# Patient Record
Sex: Female | Born: 1945 | Race: White | Hispanic: No | Smoking: Current every day smoker
Health system: Southern US, Community
[De-identification: ages and names within clinical notes are randomized; demographics above are authoritative.]

## PROBLEM LIST (undated history)

## (undated) DIAGNOSIS — Z87442 Personal history of urinary calculi: Secondary | ICD-10-CM

## (undated) DIAGNOSIS — E785 Hyperlipidemia, unspecified: Secondary | ICD-10-CM

## (undated) DIAGNOSIS — Z923 Personal history of irradiation: Secondary | ICD-10-CM

## (undated) DIAGNOSIS — C50412 Malignant neoplasm of upper-outer quadrant of left female breast: Secondary | ICD-10-CM

## (undated) DIAGNOSIS — Z889 Allergy status to unspecified drugs, medicaments and biological substances status: Secondary | ICD-10-CM

## (undated) DIAGNOSIS — I10 Essential (primary) hypertension: Secondary | ICD-10-CM

## (undated) HISTORY — PX: CATARACT EXTRACTION, BILATERAL: SHX1313

## (undated) HISTORY — PX: NO PAST SURGERIES: SHX2092

## (undated) HISTORY — DX: Hyperlipidemia, unspecified: E78.5

---

## 1998-08-07 ENCOUNTER — Other Ambulatory Visit: Admission: RE | Admit: 1998-08-07 | Discharge: 1998-08-07 | Payer: Self-pay | Admitting: Obstetrics & Gynecology

## 2000-01-31 ENCOUNTER — Encounter: Payer: Self-pay | Admitting: Obstetrics and Gynecology

## 2000-01-31 ENCOUNTER — Encounter: Admission: RE | Admit: 2000-01-31 | Discharge: 2000-01-31 | Payer: Self-pay | Admitting: Obstetrics and Gynecology

## 2001-04-14 ENCOUNTER — Encounter: Payer: Self-pay | Admitting: Obstetrics and Gynecology

## 2001-04-14 ENCOUNTER — Ambulatory Visit (HOSPITAL_COMMUNITY): Admission: RE | Admit: 2001-04-14 | Discharge: 2001-04-14 | Payer: Self-pay | Admitting: Obstetrics and Gynecology

## 2002-01-17 ENCOUNTER — Other Ambulatory Visit: Admission: RE | Admit: 2002-01-17 | Discharge: 2002-01-17 | Payer: Self-pay | Admitting: Obstetrics and Gynecology

## 2002-05-09 ENCOUNTER — Ambulatory Visit (HOSPITAL_COMMUNITY): Admission: RE | Admit: 2002-05-09 | Discharge: 2002-05-09 | Payer: Self-pay | Admitting: Obstetrics and Gynecology

## 2002-05-09 ENCOUNTER — Encounter: Payer: Self-pay | Admitting: Obstetrics and Gynecology

## 2003-05-01 ENCOUNTER — Other Ambulatory Visit: Admission: RE | Admit: 2003-05-01 | Discharge: 2003-05-01 | Payer: Self-pay | Admitting: Obstetrics and Gynecology

## 2003-05-19 ENCOUNTER — Ambulatory Visit (HOSPITAL_COMMUNITY): Admission: RE | Admit: 2003-05-19 | Discharge: 2003-05-19 | Payer: Self-pay | Admitting: Obstetrics and Gynecology

## 2004-06-10 ENCOUNTER — Ambulatory Visit (HOSPITAL_COMMUNITY): Admission: RE | Admit: 2004-06-10 | Discharge: 2004-06-10 | Payer: Self-pay | Admitting: Obstetrics and Gynecology

## 2004-08-28 ENCOUNTER — Other Ambulatory Visit: Admission: RE | Admit: 2004-08-28 | Discharge: 2004-08-28 | Payer: Self-pay | Admitting: Obstetrics and Gynecology

## 2005-07-01 ENCOUNTER — Ambulatory Visit (HOSPITAL_COMMUNITY): Admission: RE | Admit: 2005-07-01 | Discharge: 2005-07-01 | Payer: Self-pay | Admitting: Internal Medicine

## 2006-08-03 ENCOUNTER — Ambulatory Visit (HOSPITAL_COMMUNITY): Admission: RE | Admit: 2006-08-03 | Discharge: 2006-08-03 | Payer: Self-pay | Admitting: Obstetrics and Gynecology

## 2007-11-19 ENCOUNTER — Ambulatory Visit (HOSPITAL_COMMUNITY): Admission: RE | Admit: 2007-11-19 | Discharge: 2007-11-19 | Payer: Self-pay | Admitting: Obstetrics and Gynecology

## 2008-06-02 DIAGNOSIS — F32A Depression, unspecified: Secondary | ICD-10-CM

## 2008-06-02 HISTORY — DX: Depression, unspecified: F32.A

## 2009-07-27 ENCOUNTER — Ambulatory Visit (HOSPITAL_COMMUNITY): Admission: RE | Admit: 2009-07-27 | Discharge: 2009-07-27 | Payer: Self-pay | Admitting: Obstetrics and Gynecology

## 2010-06-28 ENCOUNTER — Other Ambulatory Visit (HOSPITAL_COMMUNITY): Payer: Self-pay | Admitting: Obstetrics and Gynecology

## 2010-06-28 ENCOUNTER — Other Ambulatory Visit (HOSPITAL_BASED_OUTPATIENT_CLINIC_OR_DEPARTMENT_OTHER): Payer: Self-pay | Admitting: Obstetrics and Gynecology

## 2010-06-28 DIAGNOSIS — Z1231 Encounter for screening mammogram for malignant neoplasm of breast: Secondary | ICD-10-CM

## 2010-06-28 DIAGNOSIS — Z139 Encounter for screening, unspecified: Secondary | ICD-10-CM

## 2010-07-29 ENCOUNTER — Encounter (HOSPITAL_COMMUNITY): Payer: Self-pay

## 2010-07-29 ENCOUNTER — Ambulatory Visit (HOSPITAL_COMMUNITY)
Admission: RE | Admit: 2010-07-29 | Discharge: 2010-07-29 | Disposition: A | Discharge status: Home / Self Care | Payer: Medicare Other | Source: Ambulatory Visit | Attending: Obstetrics and Gynecology | Admitting: Obstetrics and Gynecology

## 2010-07-29 DIAGNOSIS — Z1231 Encounter for screening mammogram for malignant neoplasm of breast: Secondary | ICD-10-CM | POA: Insufficient documentation

## 2010-08-01 ENCOUNTER — Other Ambulatory Visit: Payer: Self-pay | Admitting: Obstetrics and Gynecology

## 2010-08-01 DIAGNOSIS — R928 Other abnormal and inconclusive findings on diagnostic imaging of breast: Secondary | ICD-10-CM

## 2010-08-07 ENCOUNTER — Ambulatory Visit
Admission: RE | Admit: 2010-08-07 | Discharge: 2010-08-07 | Disposition: A | Discharge status: Home / Self Care | Payer: Medicare Other | Source: Ambulatory Visit | Attending: Obstetrics and Gynecology | Admitting: Obstetrics and Gynecology

## 2010-08-07 DIAGNOSIS — R928 Other abnormal and inconclusive findings on diagnostic imaging of breast: Secondary | ICD-10-CM

## 2010-10-29 ENCOUNTER — Ambulatory Visit
Admission: RE | Admit: 2010-10-29 | Discharge: 2010-10-29 | Disposition: A | Discharge status: Home / Self Care | Payer: Medicare Other | Source: Ambulatory Visit | Attending: Internal Medicine | Admitting: Internal Medicine

## 2010-10-29 ENCOUNTER — Other Ambulatory Visit: Payer: Self-pay | Admitting: Internal Medicine

## 2010-10-29 DIAGNOSIS — IMO0001 Reserved for inherently not codable concepts without codable children: Secondary | ICD-10-CM

## 2010-10-30 ENCOUNTER — Other Ambulatory Visit: Payer: Self-pay | Admitting: Internal Medicine

## 2010-10-31 ENCOUNTER — Ambulatory Visit
Admission: RE | Admit: 2010-10-31 | Discharge: 2010-10-31 | Disposition: A | Discharge status: Home / Self Care | Payer: Medicare Other | Source: Ambulatory Visit | Attending: Internal Medicine | Admitting: Internal Medicine

## 2011-07-07 DIAGNOSIS — H2510 Age-related nuclear cataract, unspecified eye: Secondary | ICD-10-CM | POA: Diagnosis not present

## 2011-07-07 DIAGNOSIS — H40019 Open angle with borderline findings, low risk, unspecified eye: Secondary | ICD-10-CM | POA: Diagnosis not present

## 2011-12-01 ENCOUNTER — Other Ambulatory Visit (HOSPITAL_COMMUNITY): Payer: Self-pay | Admitting: Obstetrics and Gynecology

## 2011-12-01 DIAGNOSIS — Z1231 Encounter for screening mammogram for malignant neoplasm of breast: Secondary | ICD-10-CM

## 2011-12-23 ENCOUNTER — Ambulatory Visit (HOSPITAL_COMMUNITY)
Admission: RE | Admit: 2011-12-23 | Discharge: 2011-12-23 | Disposition: A | Discharge status: Home / Self Care | Payer: Medicare Other | Source: Ambulatory Visit | Attending: Obstetrics and Gynecology | Admitting: Obstetrics and Gynecology

## 2011-12-23 DIAGNOSIS — Z1231 Encounter for screening mammogram for malignant neoplasm of breast: Secondary | ICD-10-CM | POA: Insufficient documentation

## 2012-01-05 DIAGNOSIS — H40019 Open angle with borderline findings, low risk, unspecified eye: Secondary | ICD-10-CM | POA: Diagnosis not present

## 2012-01-06 DIAGNOSIS — M810 Age-related osteoporosis without current pathological fracture: Secondary | ICD-10-CM | POA: Diagnosis not present

## 2012-01-06 DIAGNOSIS — E785 Hyperlipidemia, unspecified: Secondary | ICD-10-CM | POA: Diagnosis not present

## 2012-01-06 DIAGNOSIS — I10 Essential (primary) hypertension: Secondary | ICD-10-CM | POA: Diagnosis not present

## 2012-01-13 DIAGNOSIS — R5381 Other malaise: Secondary | ICD-10-CM | POA: Diagnosis not present

## 2012-01-13 DIAGNOSIS — M810 Age-related osteoporosis without current pathological fracture: Secondary | ICD-10-CM | POA: Diagnosis not present

## 2012-01-13 DIAGNOSIS — Z23 Encounter for immunization: Secondary | ICD-10-CM | POA: Diagnosis not present

## 2012-01-13 DIAGNOSIS — I10 Essential (primary) hypertension: Secondary | ICD-10-CM | POA: Diagnosis not present

## 2012-01-13 DIAGNOSIS — R5383 Other fatigue: Secondary | ICD-10-CM | POA: Diagnosis not present

## 2012-01-13 DIAGNOSIS — Z0000 Encounter for general adult medical examination without abnormal findings: Secondary | ICD-10-CM | POA: Diagnosis not present

## 2012-01-15 DIAGNOSIS — Z1212 Encounter for screening for malignant neoplasm of rectum: Secondary | ICD-10-CM | POA: Diagnosis not present

## 2012-02-03 DIAGNOSIS — M810 Age-related osteoporosis without current pathological fracture: Secondary | ICD-10-CM | POA: Diagnosis not present

## 2012-04-07 DIAGNOSIS — F17200 Nicotine dependence, unspecified, uncomplicated: Secondary | ICD-10-CM | POA: Diagnosis not present

## 2012-04-07 DIAGNOSIS — Z79899 Other long term (current) drug therapy: Secondary | ICD-10-CM | POA: Diagnosis not present

## 2012-04-07 DIAGNOSIS — M810 Age-related osteoporosis without current pathological fracture: Secondary | ICD-10-CM | POA: Diagnosis not present

## 2012-04-15 DIAGNOSIS — G4736 Sleep related hypoventilation in conditions classified elsewhere: Secondary | ICD-10-CM | POA: Diagnosis not present

## 2012-04-26 ENCOUNTER — Encounter (HOSPITAL_COMMUNITY): Payer: Medicare Other

## 2012-05-18 DIAGNOSIS — R05 Cough: Secondary | ICD-10-CM | POA: Diagnosis not present

## 2012-05-18 DIAGNOSIS — J209 Acute bronchitis, unspecified: Secondary | ICD-10-CM | POA: Diagnosis not present

## 2012-05-18 DIAGNOSIS — J301 Allergic rhinitis due to pollen: Secondary | ICD-10-CM | POA: Diagnosis not present

## 2012-05-18 DIAGNOSIS — R059 Cough, unspecified: Secondary | ICD-10-CM | POA: Diagnosis not present

## 2012-05-18 DIAGNOSIS — F17200 Nicotine dependence, unspecified, uncomplicated: Secondary | ICD-10-CM | POA: Diagnosis not present

## 2012-06-15 DIAGNOSIS — R109 Unspecified abdominal pain: Secondary | ICD-10-CM | POA: Diagnosis not present

## 2012-06-17 DIAGNOSIS — R109 Unspecified abdominal pain: Secondary | ICD-10-CM | POA: Diagnosis not present

## 2012-06-17 DIAGNOSIS — N201 Calculus of ureter: Secondary | ICD-10-CM | POA: Diagnosis not present

## 2012-07-08 DIAGNOSIS — E785 Hyperlipidemia, unspecified: Secondary | ICD-10-CM | POA: Diagnosis not present

## 2012-07-20 DIAGNOSIS — E785 Hyperlipidemia, unspecified: Secondary | ICD-10-CM | POA: Diagnosis not present

## 2012-07-20 DIAGNOSIS — Z1331 Encounter for screening for depression: Secondary | ICD-10-CM | POA: Diagnosis not present

## 2012-07-20 DIAGNOSIS — R109 Unspecified abdominal pain: Secondary | ICD-10-CM | POA: Diagnosis not present

## 2012-07-20 DIAGNOSIS — I10 Essential (primary) hypertension: Secondary | ICD-10-CM | POA: Diagnosis not present

## 2013-01-13 ENCOUNTER — Other Ambulatory Visit (HOSPITAL_COMMUNITY): Payer: Self-pay | Admitting: Internal Medicine

## 2013-01-13 DIAGNOSIS — Z1231 Encounter for screening mammogram for malignant neoplasm of breast: Secondary | ICD-10-CM

## 2013-02-01 ENCOUNTER — Ambulatory Visit (HOSPITAL_COMMUNITY)
Admission: RE | Admit: 2013-02-01 | Discharge: 2013-02-01 | Disposition: A | Discharge status: Home / Self Care | Payer: Medicare Other | Source: Ambulatory Visit | Attending: Internal Medicine | Admitting: Internal Medicine

## 2013-02-01 DIAGNOSIS — Z1231 Encounter for screening mammogram for malignant neoplasm of breast: Secondary | ICD-10-CM | POA: Diagnosis not present

## 2013-02-02 DIAGNOSIS — H40019 Open angle with borderline findings, low risk, unspecified eye: Secondary | ICD-10-CM | POA: Diagnosis not present

## 2013-02-02 DIAGNOSIS — H2510 Age-related nuclear cataract, unspecified eye: Secondary | ICD-10-CM | POA: Diagnosis not present

## 2013-03-08 DIAGNOSIS — B0052 Herpesviral keratitis: Secondary | ICD-10-CM | POA: Diagnosis not present

## 2013-03-15 DIAGNOSIS — B0052 Herpesviral keratitis: Secondary | ICD-10-CM | POA: Diagnosis not present

## 2013-04-06 DIAGNOSIS — I10 Essential (primary) hypertension: Secondary | ICD-10-CM | POA: Diagnosis not present

## 2013-04-06 DIAGNOSIS — E785 Hyperlipidemia, unspecified: Secondary | ICD-10-CM | POA: Diagnosis not present

## 2013-04-06 DIAGNOSIS — M810 Age-related osteoporosis without current pathological fracture: Secondary | ICD-10-CM | POA: Diagnosis not present

## 2013-04-14 DIAGNOSIS — Z1212 Encounter for screening for malignant neoplasm of rectum: Secondary | ICD-10-CM | POA: Diagnosis not present

## 2013-04-15 DIAGNOSIS — M810 Age-related osteoporosis without current pathological fracture: Secondary | ICD-10-CM | POA: Diagnosis not present

## 2013-04-15 DIAGNOSIS — R0902 Hypoxemia: Secondary | ICD-10-CM | POA: Diagnosis not present

## 2013-04-15 DIAGNOSIS — F17200 Nicotine dependence, unspecified, uncomplicated: Secondary | ICD-10-CM | POA: Diagnosis not present

## 2013-04-15 DIAGNOSIS — E785 Hyperlipidemia, unspecified: Secondary | ICD-10-CM | POA: Diagnosis not present

## 2013-04-15 DIAGNOSIS — R252 Cramp and spasm: Secondary | ICD-10-CM | POA: Diagnosis not present

## 2013-04-15 DIAGNOSIS — I10 Essential (primary) hypertension: Secondary | ICD-10-CM | POA: Diagnosis not present

## 2013-04-15 DIAGNOSIS — J301 Allergic rhinitis due to pollen: Secondary | ICD-10-CM | POA: Diagnosis not present

## 2013-04-15 DIAGNOSIS — Z0000 Encounter for general adult medical examination without abnormal findings: Secondary | ICD-10-CM | POA: Diagnosis not present

## 2013-10-17 DIAGNOSIS — R221 Localized swelling, mass and lump, neck: Secondary | ICD-10-CM | POA: Diagnosis not present

## 2013-10-17 DIAGNOSIS — I10 Essential (primary) hypertension: Secondary | ICD-10-CM | POA: Diagnosis not present

## 2013-10-17 DIAGNOSIS — Z1331 Encounter for screening for depression: Secondary | ICD-10-CM | POA: Diagnosis not present

## 2013-10-17 DIAGNOSIS — E785 Hyperlipidemia, unspecified: Secondary | ICD-10-CM | POA: Diagnosis not present

## 2013-10-17 DIAGNOSIS — F430 Acute stress reaction: Secondary | ICD-10-CM | POA: Diagnosis not present

## 2013-10-17 DIAGNOSIS — IMO0002 Reserved for concepts with insufficient information to code with codable children: Secondary | ICD-10-CM | POA: Diagnosis not present

## 2013-10-17 DIAGNOSIS — R220 Localized swelling, mass and lump, head: Secondary | ICD-10-CM | POA: Diagnosis not present

## 2013-10-17 DIAGNOSIS — F17200 Nicotine dependence, unspecified, uncomplicated: Secondary | ICD-10-CM | POA: Diagnosis not present

## 2014-03-13 ENCOUNTER — Other Ambulatory Visit (HOSPITAL_COMMUNITY): Payer: Self-pay | Admitting: Internal Medicine

## 2014-03-13 DIAGNOSIS — Z1231 Encounter for screening mammogram for malignant neoplasm of breast: Secondary | ICD-10-CM

## 2014-04-05 DIAGNOSIS — H2513 Age-related nuclear cataract, bilateral: Secondary | ICD-10-CM | POA: Diagnosis not present

## 2014-04-05 DIAGNOSIS — H40013 Open angle with borderline findings, low risk, bilateral: Secondary | ICD-10-CM | POA: Diagnosis not present

## 2014-04-18 DIAGNOSIS — M859 Disorder of bone density and structure, unspecified: Secondary | ICD-10-CM | POA: Diagnosis not present

## 2014-04-18 DIAGNOSIS — E785 Hyperlipidemia, unspecified: Secondary | ICD-10-CM | POA: Diagnosis not present

## 2014-04-21 ENCOUNTER — Ambulatory Visit (HOSPITAL_COMMUNITY)
Admission: RE | Admit: 2014-04-21 | Discharge: 2014-04-21 | Disposition: A | Discharge status: Home / Self Care | Payer: Medicare Other | Source: Ambulatory Visit | Attending: Internal Medicine | Admitting: Internal Medicine

## 2014-04-21 DIAGNOSIS — Z008 Encounter for other general examination: Secondary | ICD-10-CM | POA: Diagnosis not present

## 2014-04-21 DIAGNOSIS — R221 Localized swelling, mass and lump, neck: Secondary | ICD-10-CM | POA: Diagnosis not present

## 2014-04-21 DIAGNOSIS — M859 Disorder of bone density and structure, unspecified: Secondary | ICD-10-CM | POA: Diagnosis not present

## 2014-04-21 DIAGNOSIS — Z23 Encounter for immunization: Secondary | ICD-10-CM | POA: Diagnosis not present

## 2014-04-21 DIAGNOSIS — R928 Other abnormal and inconclusive findings on diagnostic imaging of breast: Secondary | ICD-10-CM | POA: Diagnosis not present

## 2014-04-21 DIAGNOSIS — R0902 Hypoxemia: Secondary | ICD-10-CM | POA: Diagnosis not present

## 2014-04-21 DIAGNOSIS — E785 Hyperlipidemia, unspecified: Secondary | ICD-10-CM | POA: Diagnosis not present

## 2014-04-21 DIAGNOSIS — I10 Essential (primary) hypertension: Secondary | ICD-10-CM | POA: Diagnosis not present

## 2014-04-21 DIAGNOSIS — N200 Calculus of kidney: Secondary | ICD-10-CM | POA: Diagnosis not present

## 2014-04-21 DIAGNOSIS — K219 Gastro-esophageal reflux disease without esophagitis: Secondary | ICD-10-CM | POA: Diagnosis not present

## 2014-04-21 DIAGNOSIS — Z1231 Encounter for screening mammogram for malignant neoplasm of breast: Secondary | ICD-10-CM | POA: Insufficient documentation

## 2014-04-26 ENCOUNTER — Other Ambulatory Visit: Payer: Self-pay | Admitting: Internal Medicine

## 2014-04-26 DIAGNOSIS — R928 Other abnormal and inconclusive findings on diagnostic imaging of breast: Secondary | ICD-10-CM

## 2014-05-05 DIAGNOSIS — Z1212 Encounter for screening for malignant neoplasm of rectum: Secondary | ICD-10-CM | POA: Diagnosis not present

## 2014-05-10 ENCOUNTER — Ambulatory Visit
Admission: RE | Admit: 2014-05-10 | Discharge: 2014-05-10 | Disposition: A | Discharge status: Home / Self Care | Payer: Medicare Other | Source: Ambulatory Visit | Attending: Internal Medicine | Admitting: Internal Medicine

## 2014-05-10 DIAGNOSIS — R928 Other abnormal and inconclusive findings on diagnostic imaging of breast: Secondary | ICD-10-CM

## 2014-05-10 DIAGNOSIS — M859 Disorder of bone density and structure, unspecified: Secondary | ICD-10-CM | POA: Diagnosis not present

## 2014-05-10 DIAGNOSIS — R921 Mammographic calcification found on diagnostic imaging of breast: Secondary | ICD-10-CM | POA: Diagnosis not present

## 2014-05-22 DIAGNOSIS — R05 Cough: Secondary | ICD-10-CM | POA: Diagnosis not present

## 2014-05-22 DIAGNOSIS — Z6821 Body mass index (BMI) 21.0-21.9, adult: Secondary | ICD-10-CM | POA: Diagnosis not present

## 2014-05-22 DIAGNOSIS — J209 Acute bronchitis, unspecified: Secondary | ICD-10-CM | POA: Diagnosis not present

## 2014-08-07 ENCOUNTER — Ambulatory Visit
Admission: RE | Admit: 2014-08-07 | Discharge: 2014-08-07 | Disposition: A | Discharge status: Home / Self Care | Payer: Medicare Other | Source: Ambulatory Visit | Attending: Internal Medicine | Admitting: Internal Medicine

## 2014-08-07 ENCOUNTER — Other Ambulatory Visit: Payer: Self-pay | Admitting: Internal Medicine

## 2014-08-07 DIAGNOSIS — N200 Calculus of kidney: Secondary | ICD-10-CM | POA: Diagnosis not present

## 2014-08-07 DIAGNOSIS — R319 Hematuria, unspecified: Secondary | ICD-10-CM

## 2014-08-07 DIAGNOSIS — R109 Unspecified abdominal pain: Secondary | ICD-10-CM

## 2014-08-07 DIAGNOSIS — R312 Other microscopic hematuria: Secondary | ICD-10-CM | POA: Diagnosis not present

## 2014-08-07 DIAGNOSIS — R8290 Unspecified abnormal findings in urine: Secondary | ICD-10-CM | POA: Diagnosis not present

## 2014-08-07 DIAGNOSIS — F17200 Nicotine dependence, unspecified, uncomplicated: Secondary | ICD-10-CM | POA: Diagnosis not present

## 2014-08-07 DIAGNOSIS — Z6821 Body mass index (BMI) 21.0-21.9, adult: Secondary | ICD-10-CM | POA: Diagnosis not present

## 2014-08-07 DIAGNOSIS — I10 Essential (primary) hypertension: Secondary | ICD-10-CM | POA: Diagnosis not present

## 2014-10-23 ENCOUNTER — Other Ambulatory Visit: Payer: Self-pay | Admitting: Internal Medicine

## 2014-10-23 DIAGNOSIS — N6310 Unspecified lump in the right breast, unspecified quadrant: Secondary | ICD-10-CM

## 2014-10-26 DIAGNOSIS — Z6822 Body mass index (BMI) 22.0-22.9, adult: Secondary | ICD-10-CM | POA: Diagnosis not present

## 2014-10-26 DIAGNOSIS — R252 Cramp and spasm: Secondary | ICD-10-CM | POA: Diagnosis not present

## 2014-10-26 DIAGNOSIS — E785 Hyperlipidemia, unspecified: Secondary | ICD-10-CM | POA: Diagnosis not present

## 2014-10-26 DIAGNOSIS — F17200 Nicotine dependence, unspecified, uncomplicated: Secondary | ICD-10-CM | POA: Diagnosis not present

## 2014-10-26 DIAGNOSIS — I10 Essential (primary) hypertension: Secondary | ICD-10-CM | POA: Diagnosis not present

## 2014-10-26 DIAGNOSIS — K219 Gastro-esophageal reflux disease without esophagitis: Secondary | ICD-10-CM | POA: Diagnosis not present

## 2014-11-10 ENCOUNTER — Ambulatory Visit
Admission: RE | Admit: 2014-11-10 | Discharge: 2014-11-10 | Disposition: A | Discharge status: Home / Self Care | Payer: Medicare Other | Source: Ambulatory Visit | Attending: Internal Medicine | Admitting: Internal Medicine

## 2014-11-10 DIAGNOSIS — R928 Other abnormal and inconclusive findings on diagnostic imaging of breast: Secondary | ICD-10-CM | POA: Diagnosis not present

## 2014-11-10 DIAGNOSIS — N6310 Unspecified lump in the right breast, unspecified quadrant: Secondary | ICD-10-CM

## 2014-12-29 DIAGNOSIS — Z6821 Body mass index (BMI) 21.0-21.9, adult: Secondary | ICD-10-CM | POA: Diagnosis not present

## 2014-12-29 DIAGNOSIS — N644 Mastodynia: Secondary | ICD-10-CM | POA: Diagnosis not present

## 2015-04-20 ENCOUNTER — Other Ambulatory Visit: Payer: Self-pay

## 2015-04-20 DIAGNOSIS — Z1231 Encounter for screening mammogram for malignant neoplasm of breast: Secondary | ICD-10-CM

## 2015-05-04 DIAGNOSIS — I10 Essential (primary) hypertension: Secondary | ICD-10-CM | POA: Diagnosis not present

## 2015-05-04 DIAGNOSIS — M810 Age-related osteoporosis without current pathological fracture: Secondary | ICD-10-CM | POA: Diagnosis not present

## 2015-05-04 DIAGNOSIS — E784 Other hyperlipidemia: Secondary | ICD-10-CM | POA: Diagnosis not present

## 2015-05-11 DIAGNOSIS — K219 Gastro-esophageal reflux disease without esophagitis: Secondary | ICD-10-CM | POA: Diagnosis not present

## 2015-05-11 DIAGNOSIS — Z6821 Body mass index (BMI) 21.0-21.9, adult: Secondary | ICD-10-CM | POA: Diagnosis not present

## 2015-05-11 DIAGNOSIS — M810 Age-related osteoporosis without current pathological fracture: Secondary | ICD-10-CM | POA: Diagnosis not present

## 2015-05-11 DIAGNOSIS — Z1389 Encounter for screening for other disorder: Secondary | ICD-10-CM | POA: Diagnosis not present

## 2015-05-11 DIAGNOSIS — I10 Essential (primary) hypertension: Secondary | ICD-10-CM | POA: Diagnosis not present

## 2015-05-11 DIAGNOSIS — N200 Calculus of kidney: Secondary | ICD-10-CM | POA: Diagnosis not present

## 2015-05-11 DIAGNOSIS — Z0000 Encounter for general adult medical examination without abnormal findings: Secondary | ICD-10-CM | POA: Diagnosis not present

## 2015-05-11 DIAGNOSIS — J302 Other seasonal allergic rhinitis: Secondary | ICD-10-CM | POA: Diagnosis not present

## 2015-05-11 DIAGNOSIS — F17200 Nicotine dependence, unspecified, uncomplicated: Secondary | ICD-10-CM | POA: Diagnosis not present

## 2015-05-11 DIAGNOSIS — E784 Other hyperlipidemia: Secondary | ICD-10-CM | POA: Diagnosis not present

## 2015-05-15 DIAGNOSIS — Z1212 Encounter for screening for malignant neoplasm of rectum: Secondary | ICD-10-CM | POA: Diagnosis not present

## 2015-05-24 ENCOUNTER — Ambulatory Visit
Admission: RE | Admit: 2015-05-24 | Discharge: 2015-05-24 | Disposition: A | Discharge status: Home / Self Care | Payer: Medicare Other | Source: Ambulatory Visit

## 2015-05-24 DIAGNOSIS — Z1231 Encounter for screening mammogram for malignant neoplasm of breast: Secondary | ICD-10-CM

## 2015-05-29 ENCOUNTER — Other Ambulatory Visit: Payer: Self-pay | Admitting: Internal Medicine

## 2015-05-29 DIAGNOSIS — R928 Other abnormal and inconclusive findings on diagnostic imaging of breast: Secondary | ICD-10-CM

## 2015-06-01 ENCOUNTER — Ambulatory Visit
Admission: RE | Admit: 2015-06-01 | Discharge: 2015-06-01 | Disposition: A | Discharge status: Home / Self Care | Payer: Medicare Other | Source: Ambulatory Visit | Attending: Internal Medicine | Admitting: Internal Medicine

## 2015-06-01 DIAGNOSIS — R928 Other abnormal and inconclusive findings on diagnostic imaging of breast: Secondary | ICD-10-CM

## 2015-06-01 DIAGNOSIS — N63 Unspecified lump in breast: Secondary | ICD-10-CM | POA: Diagnosis not present

## 2015-06-12 DIAGNOSIS — H2513 Age-related nuclear cataract, bilateral: Secondary | ICD-10-CM | POA: Diagnosis not present

## 2015-06-12 DIAGNOSIS — H40013 Open angle with borderline findings, low risk, bilateral: Secondary | ICD-10-CM | POA: Diagnosis not present

## 2015-10-07 DIAGNOSIS — L237 Allergic contact dermatitis due to plants, except food: Secondary | ICD-10-CM | POA: Diagnosis not present

## 2015-12-10 DIAGNOSIS — M47818 Spondylosis without myelopathy or radiculopathy, sacral and sacrococcygeal region: Secondary | ICD-10-CM | POA: Diagnosis not present

## 2015-12-10 DIAGNOSIS — M545 Low back pain: Secondary | ICD-10-CM | POA: Diagnosis not present

## 2015-12-10 DIAGNOSIS — Z6821 Body mass index (BMI) 21.0-21.9, adult: Secondary | ICD-10-CM | POA: Diagnosis not present

## 2015-12-21 ENCOUNTER — Other Ambulatory Visit: Payer: Self-pay | Admitting: Internal Medicine

## 2015-12-21 DIAGNOSIS — R921 Mammographic calcification found on diagnostic imaging of breast: Secondary | ICD-10-CM

## 2015-12-21 DIAGNOSIS — N6310 Unspecified lump in the right breast, unspecified quadrant: Secondary | ICD-10-CM

## 2015-12-27 ENCOUNTER — Ambulatory Visit
Admission: RE | Admit: 2015-12-27 | Discharge: 2015-12-27 | Disposition: A | Discharge status: Home / Self Care | Payer: Medicare Other | Source: Ambulatory Visit | Attending: Internal Medicine | Admitting: Internal Medicine

## 2015-12-27 DIAGNOSIS — R921 Mammographic calcification found on diagnostic imaging of breast: Secondary | ICD-10-CM

## 2015-12-27 DIAGNOSIS — N6310 Unspecified lump in the right breast, unspecified quadrant: Secondary | ICD-10-CM

## 2015-12-27 DIAGNOSIS — N63 Unspecified lump in breast: Secondary | ICD-10-CM | POA: Diagnosis not present

## 2015-12-27 DIAGNOSIS — N6001 Solitary cyst of right breast: Secondary | ICD-10-CM | POA: Diagnosis not present

## 2016-06-02 DIAGNOSIS — C50919 Malignant neoplasm of unspecified site of unspecified female breast: Secondary | ICD-10-CM

## 2016-06-02 HISTORY — DX: Malignant neoplasm of unspecified site of unspecified female breast: C50.919

## 2016-06-02 HISTORY — PX: BREAST BIOPSY: SHX20

## 2016-06-20 ENCOUNTER — Other Ambulatory Visit: Payer: Self-pay | Admitting: Internal Medicine

## 2016-06-20 DIAGNOSIS — R921 Mammographic calcification found on diagnostic imaging of breast: Secondary | ICD-10-CM

## 2016-06-30 ENCOUNTER — Ambulatory Visit
Admission: RE | Admit: 2016-06-30 | Discharge: 2016-06-30 | Disposition: A | Discharge status: Home / Self Care | Payer: Medicare Other | Source: Ambulatory Visit | Attending: Internal Medicine | Admitting: Internal Medicine

## 2016-06-30 ENCOUNTER — Other Ambulatory Visit: Payer: Self-pay | Admitting: Internal Medicine

## 2016-06-30 DIAGNOSIS — R921 Mammographic calcification found on diagnostic imaging of breast: Secondary | ICD-10-CM

## 2016-06-30 DIAGNOSIS — N6320 Unspecified lump in the left breast, unspecified quadrant: Secondary | ICD-10-CM

## 2016-06-30 DIAGNOSIS — N6489 Other specified disorders of breast: Secondary | ICD-10-CM

## 2016-07-03 ENCOUNTER — Other Ambulatory Visit: Payer: Self-pay | Admitting: Internal Medicine

## 2016-07-03 ENCOUNTER — Ambulatory Visit
Admission: RE | Admit: 2016-07-03 | Discharge: 2016-07-03 | Disposition: A | Discharge status: Home / Self Care | Payer: Medicare Other | Source: Ambulatory Visit | Attending: Internal Medicine | Admitting: Internal Medicine

## 2016-07-03 DIAGNOSIS — N6489 Other specified disorders of breast: Secondary | ICD-10-CM

## 2016-07-04 ENCOUNTER — Other Ambulatory Visit: Payer: Self-pay | Admitting: Internal Medicine

## 2016-07-04 DIAGNOSIS — R921 Mammographic calcification found on diagnostic imaging of breast: Secondary | ICD-10-CM

## 2016-07-07 ENCOUNTER — Ambulatory Visit
Admission: RE | Admit: 2016-07-07 | Discharge: 2016-07-07 | Disposition: A | Discharge status: Home / Self Care | Payer: Medicare Other | Source: Ambulatory Visit | Attending: Internal Medicine | Admitting: Internal Medicine

## 2016-07-07 ENCOUNTER — Other Ambulatory Visit: Payer: Self-pay | Admitting: Internal Medicine

## 2016-07-07 DIAGNOSIS — R921 Mammographic calcification found on diagnostic imaging of breast: Secondary | ICD-10-CM

## 2016-07-09 ENCOUNTER — Ambulatory Visit: Payer: Self-pay | Admitting: General Surgery

## 2016-07-09 DIAGNOSIS — Z170 Estrogen receptor positive status [ER+]: Principal | ICD-10-CM

## 2016-07-09 DIAGNOSIS — C50912 Malignant neoplasm of unspecified site of left female breast: Secondary | ICD-10-CM

## 2016-07-14 ENCOUNTER — Encounter: Payer: Self-pay | Admitting: Radiation Oncology

## 2016-07-16 ENCOUNTER — Encounter (HOSPITAL_BASED_OUTPATIENT_CLINIC_OR_DEPARTMENT_OTHER): Payer: Self-pay | Admitting: *Deleted

## 2016-07-16 NOTE — Progress Notes (Addendum)
Location of Breast Cancer: Left Breast Lower outer Quadrant  Histology per Pathology Report: Diagnosis 07/07/2016: Breast, right, needle core biopsy, upper inner quadrant - FIBROADENOMA WITH CALCIFICATIONS.- ADENOSIS WITH CALCIFICATIONS.- THERE IS NO EVIDENCE OF MALIGNANCY.   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 Excell Seltzer, MD seen 07/09/16:  Surgery is scheduled 07/22/16,   Past/Anticipated interventions by medical oncology, if any: Chemotherapy   Lymphedema issues, if any:  No  Pain issues, if any: No  SAFETY ISSUES:  Prior radiation?  NO  Pacemaker/ICD? NO  Possible current pregnancy?N/A  Is the patient on methotrexate? NO  Current Complaints / other details: , Widowed,  Hx previous benign cyst aspirated on right breast, menarche age 59, G48P0 Current cigarette smoker daily,1ppd, 45 years, no alcohol or drug use Sister just dx recently  with lung cancer,smoker, no other cancer in family BP (!) 101/54 (BP Location: Right Arm, Patient Position: Sitting, Cuff Size: Normal)   Pulse (!) 103   Temp 98.2 F (36.8 C) (Oral)   Resp 16   Ht '5\' 3"'$  (1.6 m)   Wt 119 lb 1.6 oz (54 kg)   BMI 21.10 kg/m      Wt Readings from Last 3 Encounters:  07/17/16 119 lb 1.6 oz (54 kg)     Rebecca Eaton, RN 07/16/2016,10:30 AM

## 2016-07-17 ENCOUNTER — Ambulatory Visit
Admission: RE | Admit: 2016-07-17 | Discharge: 2016-07-17 | Disposition: A | Discharge status: Home / Self Care | Payer: Medicare Other | Source: Ambulatory Visit | Attending: Radiation Oncology | Admitting: Radiation Oncology

## 2016-07-17 ENCOUNTER — Ambulatory Visit: Payer: Medicare Other | Admitting: Physical Therapy

## 2016-07-17 ENCOUNTER — Other Ambulatory Visit: Payer: Self-pay | Admitting: General Surgery

## 2016-07-17 ENCOUNTER — Encounter: Payer: Self-pay | Admitting: Radiation Oncology

## 2016-07-17 VITALS — BP 101/54 | HR 103 | Temp 98.2°F | Resp 16 | Ht 63.0 in | Wt 119.1 lb

## 2016-07-17 DIAGNOSIS — Z170 Estrogen receptor positive status [ER+]: Secondary | ICD-10-CM | POA: Insufficient documentation

## 2016-07-17 DIAGNOSIS — C50912 Malignant neoplasm of unspecified site of left female breast: Secondary | ICD-10-CM

## 2016-07-17 DIAGNOSIS — C50212 Malignant neoplasm of upper-inner quadrant of left female breast: Secondary | ICD-10-CM | POA: Diagnosis not present

## 2016-07-17 DIAGNOSIS — C50812 Malignant neoplasm of overlapping sites of left female breast: Secondary | ICD-10-CM

## 2016-07-17 DIAGNOSIS — Z510 Encounter for antineoplastic radiation therapy: Secondary | ICD-10-CM | POA: Insufficient documentation

## 2016-07-17 HISTORY — DX: Malignant neoplasm of upper-outer quadrant of left female breast: C50.412

## 2016-07-17 NOTE — Progress Notes (Signed)
Please see the Nurse Progress Note in the MD Initial Consult Encounter for this patient. 

## 2016-07-17 NOTE — Progress Notes (Signed)
Radiation Oncology         [phone] ________________________________  Name: Jenna Lowe MRN: [id number]  Date: 07/17/2016  DOB: 03/10/1946  CC:No primary care provider on file.  Excell Seltzer, MD     REFERRING PHYSICIAN: Excell Seltzer, MD   DIAGNOSIS: The encounter diagnosis was Cancer of midline of breast, left ([place]). 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 female seen at the request of Dr. Excell Seltzer for a new diagnosis of left breast cancer. The patient has been followed closely with 6 month mammograms since November 2015 for a right breast questionable density believed to be benign mildly asymmetric fibroglandular tissue and scattered benign-appearing calcifications. Diagnostic bilateral mammogram and ultrasound on 06/30/16 showed a previously noted 0.4 cm group of coarse calcifications in the right breast unchanged from her December 2016 exam, an area of distortion in the posterior left breast measuring about 0.6 cm, and no left axillary adenopathy. The patient had biopsy of the left mid breast on 07/03/16 revealing grade 1 invasive ductal carcinoma and DCIS with calcifications (ER 100% +, PR 30% +, HER2 -, Ki67 10%). The patient then proceeded with biopsy of the UIQ of the right breast on 07/07/16 revealing fibroadnoma with calcifications, adenosis with calcifications, and no evidence of malignancy. The patient saw Dr. Excell Seltzer on 07/09/16 who recommended surgery and referral to med/onc, rad/onc, and physical therapy.  PREVIOUS RADIATION THERAPY: No   PAST MEDICAL HISTORY:  Past Medical History:  Diagnosis Date  . Cancer ([place])   . H/O seasonal allergies   . Hypertension        PAST SURGICAL HISTORY: Past Surgical History:  Procedure Laterality Date  . NO PAST SURGERIES       FAMILY HISTORY: No family history on file.   SOCIAL HISTORY:  reports that she has  been smoking Cigarettes.  She has been smoking about 1.00 pack per day. She has never used smokeless tobacco. She reports that she does not drink alcohol or use drugs. She lives in [place], and plans to continue her care in [place].   ALLERGIES: Pseudoephedrine; Sulfa antibiotics; and Augmentin [amoxicillin-pot clavulanate]   MEDICATIONS:  Current Outpatient Prescriptions  Medication Sig Dispense Refill  . aspirin 81 MG tablet Take 81 mg by mouth daily.    . cetirizine (ZYRTEC) 10 MG chewable tablet Chew 10 mg by mouth daily.    . fluticasone (FLONASE) 50 MCG/ACT nasal spray Place into both nostrils daily.    . hydrochlorothiazide (MICROZIDE) 12.5 MG capsule Take 12.5 mg by mouth daily.    Marland Kitchen ibuprofen (ADVIL,MOTRIN) 200 MG tablet Take 200 mg by mouth every 8 (eight) hours as needed for mild pain.    Marland Kitchen lisinopril (PRINIVIL,ZESTRIL) 20 MG tablet Take 20 mg by mouth daily.    . Multiple Vitamins-Minerals (MULTIVITAMIN WITH MINERALS) tablet Take 1 tablet by mouth daily.    . Omega-3 Fat Ac-Cholecalciferol (MINICAPS VITAMIN-D/OMEGA-3 PO) Take 1 capsule by mouth daily.     No current facility-administered medications for this encounter.      REVIEW OF SYSTEMS: On review of systems, the patient reports that she is doing well overall. She denies any chest pain, shortness of breath, cough, fevers, chills, night sweats, unintended weight changes. She denies any bowel or bladder disturbances, and denies abdominal pain, nausea or vomiting. She denies any new musculoskeletal or joint aches or pains. A complete review of systems is obtained  and is otherwise negative.     PHYSICAL EXAM:  Wt Readings from Last 3 Encounters:  07/17/16 119 lb 1.6 oz (54 kg)   Temp Readings from Last 3 Encounters:  07/17/16 98.2 F (36.8 C) (Oral)   BP Readings from Last 3 Encounters:  07/17/16 (!) 101/54   Pulse Readings from Last 3 Encounters:  07/17/16 (!) 103   Pain Assessment Pain Score: 0-No  pain/10  In general this is a well appearing Caucasian female in no acute distress. She is alert and oriented x4 and appropriate throughout the examination. HEENT reveals that the patient is normocephalic, atraumatic. EOMs are intact. PERRLA. Skin is intact without any evidence of gross lesions. Cardiovascular exam reveals a regular rate and rhythm, no clicks rubs or murmurs are auscultated. Chest is clear to auscultation bilaterally. Lymphatic assessment is performed and does not reveal any adenopathy in the cervical, supraclavicular, axillary, or inguinal chains. Breast exam reveals Abdomen has active bowel sounds in all quadrants and is intact. The abdomen is soft, non tender, non distended. Lower extremities are negative for pretibial pitting edema, deep calf tenderness, cyanosis or clubbing.     ECOG = 0       0 - Asymptomatic (Fully active, able to carry on all predisease activities without restriction)  1 - Symptomatic but completely ambulatory (Restricted in physically strenuous activity but ambulatory and able to carry out work of a light or sedentary nature. For example, light housework, office work)  2 - Symptomatic, <50% in bed during the day (Ambulatory and capable of all self care but unable to carry out any work activities. Up and about more than 50% of waking hours)  3 - Symptomatic, >50% in bed, but not bedbound (Capable of only limited self-care, confined to bed or chair 50% or more of waking hours)  4 - Bedbound (Completely disabled. Cannot carry on any self-care. Totally confined to bed or chair)  5 - Death   Jenna Lowe MM, Creech RH, Tormey DC, et al. [phone]). "Toxicity and response criteria of the [facility]". [place] Oncol. 5 (6): 649-55    LABORATORY DATA:  No results found for: WBC, HGB, HCT, MCV, PLT No results found for: NA, K, CL, CO2 No results found for: ALT, AST, GGT, ALKPHOS, BILITOT    RADIOGRAPHY: US Breast Ltd Uni Left Inc  Axilla  Result Date: 06/30/2016 CLINICAL DATA:  Right breast follow-up EXAM: 2D DIGITAL DIAGNOSTIC BILATERAL MAMMOGRAM WITH CAD AND ADJUNCT TOMO ULTRASOUND LEFT BREAST COMPARISON:  Previous exam(s). ACR Breast Density Category b: There are scattered areas of fibroglandular density. FINDINGS: The previously noted 4 mm group of coarse calcifications within the medial right breast are not significantly changed from the December 2016 exam. There is an area of distortion within the posterior left breast, only seen on the MLO and full lateral views. The distortion appears to be within the lateral breast on the tomosynthesis imaging. Mammographic images were processed with CAD. Targeted ultrasound of the lateral left breast was performed demonstrating no definite sonographic correlate for the area of concern. Ultrasound of the left axilla demonstrates no suspicious appearing axillary lymph nodes. IMPRESSION: 1. Indeterminate left breast distortion. 2. Probably benign right breast calcifications. RECOMMENDATION: 1. Tomosynthesis guided biopsy of the left breast distortion. 2. Options including continued follow-up of the right breast calcifications versus stereotactic guided biopsy were discussed with the patient. The patient wishes to first proceed with the left breast biopsy. The patient is aware that biopsy of the  right breast calcifications may be recommended depending on the left breast biopsy results. I have discussed the findings and recommendations with the patient. Results were also provided in writing at the conclusion of the visit. If applicable, a reminder letter will be sent to the patient regarding the next appointment. BI-RADS CATEGORY  4: Suspicious. Electronically Signed   By: Pamelia Hoit M.D.   On: 06/30/2016 12:58   Mm Diag Breast Tomo Bilateral  Result Date: 06/30/2016 CLINICAL DATA:  Right breast follow-up EXAM: 2D DIGITAL DIAGNOSTIC BILATERAL MAMMOGRAM WITH CAD AND ADJUNCT TOMO ULTRASOUND LEFT  BREAST COMPARISON:  Previous exam(s). ACR Breast Density Category b: There are scattered areas of fibroglandular density. FINDINGS: The previously noted 4 mm group of coarse calcifications within the medial right breast are not significantly changed from the December 2016 exam. There is an area of distortion within the posterior left breast, only seen on the MLO and full lateral views. The distortion appears to be within the lateral breast on the tomosynthesis imaging. Mammographic images were processed with CAD. Targeted ultrasound of the lateral left breast was performed demonstrating no definite sonographic correlate for the area of concern. Ultrasound of the left axilla demonstrates no suspicious appearing axillary lymph nodes. IMPRESSION: 1. Indeterminate left breast distortion. 2. Probably benign right breast calcifications. RECOMMENDATION: 1. Tomosynthesis guided biopsy of the left breast distortion. 2. Options including continued follow-up of the right breast calcifications versus stereotactic guided biopsy were discussed with the patient. The patient wishes to first proceed with the left breast biopsy. The patient is aware that biopsy of the right breast calcifications may be recommended depending on the left breast biopsy results. I have discussed the findings and recommendations with the patient. Results were also provided in writing at the conclusion of the visit. If applicable, a reminder letter will be sent to the patient regarding the next appointment. BI-RADS CATEGORY  4: Suspicious. Electronically Signed   By: Pamelia Hoit M.D.   On: 06/30/2016 12:58   Mm Clip Placement Left  Result Date: 07/03/2016 CLINICAL DATA:  Patient is post stereotactic core needle biopsy of one view subtle mammographic distortion over the mid breast on the ML/MLO view. EXAM: DIAGNOSTIC LEFT MAMMOGRAM POST STEREOTACTIC BIOPSY COMPARISON:  Previous exam(s). FINDINGS: Mammographic images were obtained following stereotactic  guided biopsy of subtle distortion over left breast. Images demonstrates satisfactory placement of a coil shaped metallic clip over the targeted distortion. IMPRESSION: Satisfactory clip placement post stereotactic core needle biopsy left breast distortion. Final Assessment: Post Procedure Mammograms for Marker Placement Electronically Signed   By: Marin Olp M.D.   On: 07/03/2016 11:25   Mm Clip Placement Right  Result Date: 07/07/2016 CLINICAL DATA:  Status post stereotactic guided right breast biopsy EXAM: DIAGNOSTIC RIGHT MAMMOGRAM POST STEREOTACTIC BIOPSY COMPARISON:  Previous exam(s). FINDINGS: Mammographic images were obtained following stereotactic guided biopsy of indeterminate right breast calcifications. Post biopsy mammogram demonstrates the X shaped biopsy marker to be in the expected location within the upper, inner right breast. IMPRESSION: Appropriate marker position as above. Final Assessment: Post Procedure Mammograms for Marker Placement Electronically Signed   By: Pamelia Hoit M.D.   On: 07/07/2016 11:21   Mm Lt Breast Bx W Loc Dev 1st Lesion Image Bx Spec Stereo Guide  Addendum Date: 07/04/2016   ADDENDUM REPORT: 07/04/2016 12:49 ADDENDUM: Pathology revealed GRADE I INVASIVE DUCTAL CARCINOMA, DUCTAL CARCINOMA IN SITU WITH CALCIFICATIONS of the Left mid breast. This was found to be concordant by Dr. Marin Olp. Pathology results  were discussed with the patient by telephone. The patient reported doing well after the biopsy with tenderness at the site. Post biopsy instructions and care were reviewed and questions were answered. The patient was encouraged to call [facility] for any additional concerns. Surgical consultation has been arranged with Dr. Excell Seltzer at [facility] on July 09, 2016. The patient is scheduled for a Right breast stereotatic biopsy on July 07, 2016 at [facility]. Pathology results reported by Terie Purser,  RN on 07/04/2016. Electronically Signed   By: Marin Olp M.D.   On: 07/04/2016 12:49   Result Date: 07/03/2016 CLINICAL DATA:  Patient presents for stereotactic core needle biopsy of subtle one view mammographic distortion over the posterior third of the mid breast on the MLO/lateral image. EXAM: LEFT BREAST STEREOTACTIC CORE NEEDLE BIOPSY COMPARISON:  Previous exams. FINDINGS: The patient and I discussed the procedure of stereotactic-guided biopsy including benefits and alternatives. We discussed the high likelihood of a successful procedure. We discussed the risks of the procedure including infection, bleeding, tissue injury, clip migration, and inadequate sampling. Informed written consent was given. The usual time out protocol was performed immediately prior to the procedure. Using sterile technique and 1% Lidocaine as local anesthetic, under stereotactic guidance, a 9 gauge vacuum assisted biopsy device was used to perform core needle biopsy of the targeted subtle distortion using a lateral to medial approach. At the conclusion of the procedure, a coil shaped tissue marker clip was deployed into the biopsy cavity. Follow-up 2-view mammogram was performed and dictated separately. IMPRESSION: Stereotactic-guided biopsy of subtle left breast distortion. No apparent complications. Electronically Signed: By: Marin Olp M.D. On: 07/03/2016 11:19   Mm Rt Breast Bx W Loc Dev 1st Lesion Image Bx Spec Stereo Guide  Addendum Date: 07/09/2016   ADDENDUM REPORT: 07/08/2016 13:42 ADDENDUM: Pathology revealed FIBROADENOMA WITH CALCIFICATIONS, ADENOSIS WITH CALCIFICATIONS of the Right breast, upper inner quadrant. This was found to be concordant by Dr. Pamelia Hoit. Pathology results were discussed with the patient by telephone. The patient reported doing well after the biopsy with tenderness at the site. Post biopsy instructions and care were reviewed and questions were answered. The patient was encouraged to call [facility] for any additional concerns. The patient has a recent diagnosis of left breast cancer and should follow her outlined treatment plan. Pathology results reported by Terie Purser, RN on 07/08/2016. Electronically Signed   By: Pamelia Hoit M.D.   On: 07/08/2016 13:42   Result Date: 07/09/2016 CLINICAL DATA:  71 year old female with recently diagnosed invasive ductal carcinoma of the left breast. She presents for biopsy of indeterminate right breast calcifications. EXAM: RIGHT BREAST STEREOTACTIC CORE NEEDLE BIOPSY COMPARISON:  Previous exams. FINDINGS: The patient and I discussed the procedure of stereotactic-guided biopsy including benefits and alternatives. We discussed the high likelihood of a successful procedure. We discussed the risks of the procedure including infection, bleeding, tissue injury, clip migration, and inadequate sampling. Informed written consent was given. The usual time out protocol was performed immediately prior to the procedure. Using sterile technique and 1% Lidocaine as local anesthetic, under stereotactic guidance, a 9 gauge vacuum assist device was used to perform core needle biopsy of calcifications in the upper, inner right breast using a medial to lateral approach. Specimen radiograph was performed showing calcifications within the specimen. Specimens with calcifications are identified for pathology. At the conclusion of the procedure, an X shaped tissue marker clip was  deployed into the biopsy cavity. Follow-up 2-view mammogram was performed and dictated separately. IMPRESSION: Stereotactic-guided biopsy of indeterminate right breast calcifications. No apparent complications. Electronically Signed: By: Pamelia Hoit M.D. On: 07/07/2016 11:20       IMPRESSION/PLAN: 1. Stage IA cT1bN0, ER/PR positive, grade 1 invasive ductal carcinoma of the left breast. Dr. Lisbeth Renshaw discussed the pathology findings and reviewed the nature of invasive ductal carcinoma of  the breast. The consensus is breast conservation with lumpectomy and sentinel mapping. She has a small tumor, but if this was 10 mm or larger at the time of surgery, medical oncology would likely order oncotype testing. Provided that chemotherapy is not indicated, the patient's course would then be followed by external radiotherapy to the breast followed by antiestrogen therapy. We discussed the risks, benefits, short, and long term effects of radiotherapy.  Dr. Lisbeth Renshaw discussed the delivery and logistics of radiotherapy, and recommends a 4 week course. The patient is interested in proceeding when appropriate.  The patient is scheduled for lumpectomy and SLN mapping early next week by Dr. Excell Seltzer. We will see her back on 08/11/16 for follow up, and she will see medical oncology that day as well.   The above documentation reflects my direct findings during this shared patient visit. Please see the separate note by Dr. Lisbeth Renshaw on this date for the remainder of the patient's plan of care.    Carola Rhine, PAC  This document serves as a record of services personally performed by Shona Simpson, PA-C and Kyung Rudd, MD. It was created on their behalf by Darcus Austin, a trained medical scribe. The creation of this record is based on the scribe's personal observations and the providers' statements to them. This document has been checked and approved by the attending provider.

## 2016-07-21 ENCOUNTER — Ambulatory Visit
Admission: RE | Admit: 2016-07-21 | Discharge: 2016-07-21 | Disposition: A | Discharge status: Home / Self Care | Payer: Medicare Other | Source: Ambulatory Visit | Attending: General Surgery | Admitting: General Surgery

## 2016-07-21 DIAGNOSIS — C50912 Malignant neoplasm of unspecified site of left female breast: Secondary | ICD-10-CM

## 2016-07-21 DIAGNOSIS — Z170 Estrogen receptor positive status [ER+]: Principal | ICD-10-CM

## 2016-07-21 NOTE — Progress Notes (Signed)
Boost breeze given to be completed by 0530 day of surgery. Pt verbalized understanding.

## 2016-07-21 NOTE — H&P (Signed)
History of Present Illness Marland Kitchen T. Jamyrah Saur MD; 07/09/2016 3:33 PM) The patient is a 71 year old female who presents with breast cancer. She is a post menopausal female referred by Dr. Pamelia Hoit for evaluation of recently diagnosed carcinoma of the left breast. She recently presented for a screening mamogram revealing known calcifications in the right breast and a possible new area of distortion in the left breast.. Subsequent imaging included diagnostic mamogram showing stable but still slightly suspicious calcifications in the right breast as well as a new area of indeterminant distortion in the posterior lateral left breast which on review measures only about 6 mm. No ultrasound correlates. 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 She is seen now in the office for initial treatment planning. She has experienced no breast symptoms, specifically lump or pain, skin changes or nipple discharge. She previously has had a benign cyst aspirated on the right.  Findings at that time were the following: Tumor size: 0.6 cm Tumor grade: 1, Ki-67 10% Estrogen Receptor: 100% positive Progesterone Receptor: 30% positive Her-2 neu: Negative Lymph node status: Negative    Past Surgical History Nance Pear, CMA; 07/09/2016 2:57 PM) No pertinent past surgical history   Diagnostic Studies History Nance Pear, [place]; 07/09/2016 2:57 PM) Colonoscopy  5-10 years ago Mammogram  within last year Pap Smear  >5 years ago  Allergies Nance Pear, CMA; 07/09/2016 2:57 PM) No Known Drug Allergies 07/09/2016 Allergies Reconciled   Medication History Nance Pear, CMA; 07/09/2016 2:58 PM) Fluticasone Propionate (50MCG/ACT Suspension, Nasal as needed) Active. Lisinopril ('20MG'$  Tablet, Oral  daily) Active. HydroCHLOROthiazide (12.'5MG'$  Tablet, Oral daily) Active. Multi-Minerals (Oral as needed) Active. Aspirin ('81MG'$  Tablet, Oral daily) Active. Medications Reconciled  Social History Nance Pear, [place]; 07/09/2016 2:57 PM) No alcohol use  No caffeine use  No drug use  Tobacco use  Current every day smoker.  Family History Nance Pear, [place]; 07/09/2016 2:57 PM) Diabetes Mellitus  Brother, Sister. Heart Disease  Father. Hypertension  Brother, Sister.  Pregnancy / Birth History Nance Pear, [place]; 07/09/2016 2:57 PM) Age at menarche  36 years. Age of menopause  80-55 Gravida  0 Para  0  Other Problems Nance Pear, [place]; 07/09/2016 2:57 PM) Breast Cancer  High blood pressure  Hypercholesterolemia  Lump In Breast     Review of Systems Nance Pear CMA; 07/09/2016 2:57 PM) General Not Present- Appetite Loss, Chills, Fatigue, Fever, Night Sweats, Weight Gain and Weight Loss. Skin Not Present- Change in Wart/Mole, Dryness, Hives, Jaundice, New Lesions, Non-Healing Wounds, Rash and Ulcer. HEENT Present- Seasonal Allergies and Wears glasses/contact lenses. Not Present- Earache, Hearing Loss, Hoarseness, Nose Bleed, Oral Ulcers, Ringing in the Ears, Sinus Pain, Sore Throat, Visual Disturbances and Yellow Eyes. Respiratory Not Present- Bloody sputum, Chronic Cough, Difficulty Breathing, Snoring and Wheezing. Breast Present- Breast Mass. Not Present- Breast Pain, Nipple Discharge and Skin Changes. Cardiovascular Not Present- Chest Pain, Difficulty Breathing Lying Down, Leg Cramps, Palpitations, Rapid Heart Rate, Shortness of Breath and Swelling of Extremities. Gastrointestinal Not Present- Abdominal Pain, Bloating, Bloody Stool, Change in Bowel Habits, Chronic diarrhea, Constipation, Difficulty Swallowing, Excessive gas, Gets full quickly at meals, Hemorrhoids, Indigestion, Nausea, Rectal Pain and Vomiting. Female Genitourinary Not Present-  Frequency, Nocturia, Painful Urination, Pelvic Pain and Urgency. Musculoskeletal Not Present- Back Pain, Joint Pain, Joint Stiffness, Muscle Pain, Muscle  Weakness and Swelling of Extremities. Neurological Not Present- Decreased Memory, Fainting, Headaches, Numbness, Seizures, Tingling, Tremor, Trouble walking and Weakness. Psychiatric Not Present- Anxiety, Bipolar, Change in Sleep Pattern, Depression, Fearful and Frequent crying. Endocrine Not Present- Cold Intolerance, Excessive Hunger, Hair Changes, Heat Intolerance, Hot flashes and New Diabetes. Hematology Not Present- Blood Thinners, Easy Bruising, Excessive bleeding, Gland problems, HIV and Persistent Infections.  Vitals Bary Castilla Bradford CMA; 07/09/2016 2:58 PM) 07/09/2016 2:58 PM Weight: 120.4 lb Height: 63in Body Surface Area: 1.56 m Body Mass Index: 21.33 kg/m  Temp.: 98.88F  Pulse: 120 (Regular)  BP: 122/82 (Sitting, Left Arm, Standard)       Physical Exam Marland Kitchen T. Liddy Deam MD; 07/09/2016 3:52 PM) The physical exam findings are as follows: Note:General: Alert, thin Caucasian female, in no distress Skin: Warm and dry without rash or infection. HEENT: No palpable masses or thyromegaly. Sclera nonicteric. Pupils equal round and reactive. Lymph nodes: No cervical, supraclavicular, nodes palpable. Breasts: Some bruising lateral left breast post biopsy. Some thickening lower outer left breast at the biopsy site but no discrete masses in either breast. No skin changes. No nipple crusting or inversion Lungs: Breath sounds clear and equal. Mild expiratory wheezing without increased work of breathing. Cardiovascular: Regular rate and rhythm without murmer. No JVD or edema. Abdomen: Nondistended. Soft and nontender. No masses palpable. No organomegaly. No palpable hernias. Extremities: No edema or joint swelling or deformity. No chronic venous stasis changes. Neurologic: Alert and fully oriented. Gait normal. No  focal weakness. Psychiatric: Normal mood and affect. Thought content appropriate with normal judgement and insight    Assessment & Plan Marland Kitchen T. Jalie Eiland MD; 07/09/2016 4:00 PM) MALIGNANT NEOPLASM OF LOWER-OUTER QUADRANT OF LEFT BREAST OF FEMALE, ESTROGEN RECEPTOR POSITIVE (C50.512) Impression: 71 year old female with a new diagnosis of cancer of the left breast, lower outer quadrant. Clinical stage IA, ER +, PR +, HER-2 -. I discussed with the patient and family members present today initial surgical treatment options. We discussed options of breast conservation with lumpectomy or total mastectomy and sentinal lymph node biopsy/dissection. Options for reconstruction were discussed. After discussion she would like a little further time to consider her surgical options. We discussed the indications and nature of the procedures, and expected recovery, in detail. Surgical risks including anesthetic complications, cardiorespiratory complications, bleeding, infection, wound healing complications, blood clots, lymphedema, local and distant recurrence and possible need for further surgery based on the final pathology was discussed and understood. Chemotherapy, hormonal therapy and radiation therapy have been discussed. They have been provided with literature regarding the treatment of breast cancer. All questions were answered. She will consider her options and call me with her decision or further questions as needed. Current Plans Referred to Oncology, for evaluation and follow up (Oncology). Routine. Referred to Radiation Oncology, for evaluation and follow up (Radiation Oncology). Routine. Referred to Physical Therapy, for evaluation and follow up (Physical Therapy). Routine. Pt Education - [facility] Breast Cancer Information Given - [employer] "Breast Journey" Package Call back in 1 week with progress Pt Education - [facility] Free Text Education/Instructions: discussed with patient and provided  information. Addendum Note(Jaselyn Nahm T. Avner Stroder MD; 07/09/2016 6:12 PM) After completing my note but prior to the patient leaving the office today she states that she would like to proceed with breast conservation with left breast lumpectomy and axillary sentinel lymph node biopsy. We will go ahead and work on scheduling this for her.

## 2016-07-21 NOTE — Progress Notes (Signed)
Left VM for surgery scheduler re: consent order which doesn't indicate sentinel lymph node verbiage although is posted with it. Awaiting return call to see if new order should be placed for proper consent order.

## 2016-07-22 ENCOUNTER — Ambulatory Visit (HOSPITAL_BASED_OUTPATIENT_CLINIC_OR_DEPARTMENT_OTHER): Payer: Medicare Other | Admitting: Anesthesiology

## 2016-07-22 ENCOUNTER — Ambulatory Visit
Admission: RE | Admit: 2016-07-22 | Discharge: 2016-07-22 | Disposition: A | Discharge status: Home / Self Care | Payer: Medicare Other | Source: Ambulatory Visit | Attending: General Surgery | Admitting: General Surgery

## 2016-07-22 ENCOUNTER — Ambulatory Visit (HOSPITAL_BASED_OUTPATIENT_CLINIC_OR_DEPARTMENT_OTHER)
Admission: RE | Admit: 2016-07-22 | Discharge: 2016-07-22 | Disposition: A | Discharge status: Home / Self Care | Payer: Medicare Other | Source: Ambulatory Visit | Attending: General Surgery | Admitting: General Surgery

## 2016-07-22 ENCOUNTER — Encounter (HOSPITAL_BASED_OUTPATIENT_CLINIC_OR_DEPARTMENT_OTHER)
Admission: RE | Disposition: A | Discharge status: Home / Self Care | Payer: Self-pay | Source: Ambulatory Visit | Attending: General Surgery

## 2016-07-22 ENCOUNTER — Encounter (HOSPITAL_BASED_OUTPATIENT_CLINIC_OR_DEPARTMENT_OTHER): Payer: Self-pay | Admitting: Anesthesiology

## 2016-07-22 ENCOUNTER — Ambulatory Visit (HOSPITAL_COMMUNITY)
Admission: RE | Admit: 2016-07-22 | Discharge: 2016-07-22 | Disposition: A | Discharge status: Home / Self Care | Payer: Medicare Other | Source: Ambulatory Visit | Attending: General Surgery | Admitting: General Surgery

## 2016-07-22 DIAGNOSIS — I10 Essential (primary) hypertension: Secondary | ICD-10-CM | POA: Insufficient documentation

## 2016-07-22 DIAGNOSIS — F17200 Nicotine dependence, unspecified, uncomplicated: Secondary | ICD-10-CM | POA: Insufficient documentation

## 2016-07-22 DIAGNOSIS — Z780 Asymptomatic menopausal state: Secondary | ICD-10-CM | POA: Diagnosis not present

## 2016-07-22 DIAGNOSIS — Z170 Estrogen receptor positive status [ER+]: Principal | ICD-10-CM

## 2016-07-22 DIAGNOSIS — C50912 Malignant neoplasm of unspecified site of left female breast: Secondary | ICD-10-CM

## 2016-07-22 DIAGNOSIS — C50512 Malignant neoplasm of lower-outer quadrant of left female breast: Secondary | ICD-10-CM | POA: Insufficient documentation

## 2016-07-22 HISTORY — DX: Allergy status to unspecified drugs, medicaments and biological substances: Z88.9

## 2016-07-22 HISTORY — PX: BREAST LUMPECTOMY: SHX2

## 2016-07-22 HISTORY — PX: BREAST LUMPECTOMY WITH RADIOACTIVE SEED AND SENTINEL LYMPH NODE BIOPSY: SHX6550

## 2016-07-22 HISTORY — DX: Essential (primary) hypertension: I10

## 2016-07-22 LAB — POCT I-STAT, CHEM 8
BUN: 14 mg/dL (ref 6–20)
CALCIUM ION: 1.29 mmol/L (ref 1.15–1.40)
Chloride: 105 mmol/L (ref 101–111)
Creatinine, Ser: 0.6 mg/dL (ref 0.44–1.00)
GLUCOSE: 88 mg/dL (ref 65–99)
HCT: 39 % (ref 36.0–46.0)
HEMOGLOBIN: 13.3 g/dL (ref 12.0–15.0)
POTASSIUM: 3.7 mmol/L (ref 3.5–5.1)
Sodium: 143 mmol/L (ref 135–145)
TCO2: 26 mmol/L (ref 0–100)

## 2016-07-22 SURGERY — BREAST LUMPECTOMY WITH RADIOACTIVE SEED AND SENTINEL LYMPH NODE BIOPSY
Anesthesia: General | Site: Breast | Laterality: Left

## 2016-07-22 MED ORDER — LACTATED RINGERS IV SOLN
INTRAVENOUS | Status: DC
  Administered 2016-07-22 (×2): via INTRAVENOUS

## 2016-07-22 MED ORDER — EPHEDRINE SULFATE-NACL 50-0.9 MG/10ML-% IV SOSY
PREFILLED_SYRINGE | INTRAVENOUS | Status: DC | PRN
  Administered 2016-07-22 (×2): 10 mg via INTRAVENOUS
  Administered 2016-07-22 (×2): 15 mg via INTRAVENOUS

## 2016-07-22 MED ORDER — PROPOFOL 10 MG/ML IV BOLUS
INTRAVENOUS | Status: AC
  Filled 2016-07-22: qty 40

## 2016-07-22 MED ORDER — GABAPENTIN 300 MG PO CAPS
ORAL_CAPSULE | ORAL | Status: AC
  Filled 2016-07-22: qty 1

## 2016-07-22 MED ORDER — BUPIVACAINE-EPINEPHRINE (PF) 0.5% -1:200000 IJ SOLN
INTRAMUSCULAR | Status: DC | PRN
  Administered 2016-07-22: 10 mL

## 2016-07-22 MED ORDER — ARTIFICIAL TEARS OP OINT
TOPICAL_OINTMENT | OPHTHALMIC | Status: AC
  Filled 2016-07-22: qty 3.5

## 2016-07-22 MED ORDER — BSS IO SOLN
INTRAOCULAR | Status: AC
  Filled 2016-07-22: qty 15

## 2016-07-22 MED ORDER — CEFAZOLIN SODIUM-DEXTROSE 2-4 GM/100ML-% IV SOLN
2.0000 g | INTRAVENOUS | Status: AC
  Administered 2016-07-22: 2 g via INTRAVENOUS

## 2016-07-22 MED ORDER — PROPOFOL 10 MG/ML IV BOLUS
INTRAVENOUS | Status: DC | PRN
  Administered 2016-07-22: 150 mg via INTRAVENOUS

## 2016-07-22 MED ORDER — DEXAMETHASONE SODIUM PHOSPHATE 4 MG/ML IJ SOLN
INTRAMUSCULAR | Status: DC | PRN
  Administered 2016-07-22: 10 mg via INTRAVENOUS

## 2016-07-22 MED ORDER — SODIUM CHLORIDE 0.9 % IJ SOLN
INTRAMUSCULAR | Status: DC | PRN
  Administered 2016-07-22: 5 mL via INTRAMUSCULAR

## 2016-07-22 MED ORDER — ONDANSETRON HCL 4 MG/2ML IJ SOLN
INTRAMUSCULAR | Status: DC | PRN
  Administered 2016-07-22: 4 mg via INTRAVENOUS

## 2016-07-22 MED ORDER — SCOPOLAMINE 1 MG/3DAYS TD PT72: 1.0000 | MEDICATED_PATCH | Freq: Once | TRANSDERMAL | Status: DC | PRN

## 2016-07-22 MED ORDER — TECHNETIUM TC 99M SULFUR COLLOID FILTERED
1.0000 | Freq: Once | INTRAVENOUS | Status: AC | PRN
  Administered 2016-07-22: 1 via INTRADERMAL

## 2016-07-22 MED ORDER — CELECOXIB 400 MG PO CAPS
400.0000 mg | ORAL_CAPSULE | ORAL | Status: AC
  Administered 2016-07-22: 400 mg via ORAL

## 2016-07-22 MED ORDER — PHENYLEPHRINE 40 MCG/ML (10ML) SYRINGE FOR IV PUSH (FOR BLOOD PRESSURE SUPPORT)
PREFILLED_SYRINGE | INTRAVENOUS | Status: AC
  Filled 2016-07-22: qty 10

## 2016-07-22 MED ORDER — CEFAZOLIN SODIUM-DEXTROSE 2-4 GM/100ML-% IV SOLN
INTRAVENOUS | Status: AC
  Filled 2016-07-22: qty 100

## 2016-07-22 MED ORDER — MIDAZOLAM HCL 2 MG/2ML IJ SOLN
INTRAMUSCULAR | Status: AC
  Filled 2016-07-22: qty 2

## 2016-07-22 MED ORDER — FENTANYL CITRATE (PF) 100 MCG/2ML IJ SOLN
INTRAMUSCULAR | Status: DC | PRN
  Administered 2016-07-22 (×2): 25 ug via INTRAVENOUS
  Administered 2016-07-22: 50 ug via INTRAVENOUS

## 2016-07-22 MED ORDER — PHENYLEPHRINE 40 MCG/ML (10ML) SYRINGE FOR IV PUSH (FOR BLOOD PRESSURE SUPPORT)
PREFILLED_SYRINGE | INTRAVENOUS | Status: DC | PRN
  Administered 2016-07-22: 80 ug via INTRAVENOUS
  Administered 2016-07-22: 120 ug via INTRAVENOUS
  Administered 2016-07-22: 80 ug via INTRAVENOUS
  Administered 2016-07-22: 40 ug via INTRAVENOUS
  Administered 2016-07-22: 80 ug via INTRAVENOUS

## 2016-07-22 MED ORDER — ONDANSETRON HCL 4 MG/2ML IJ SOLN
INTRAMUSCULAR | Status: AC
  Filled 2016-07-22: qty 2

## 2016-07-22 MED ORDER — FENTANYL CITRATE (PF) 100 MCG/2ML IJ SOLN
INTRAMUSCULAR | Status: AC
  Filled 2016-07-22: qty 2

## 2016-07-22 MED ORDER — BUPIVACAINE-EPINEPHRINE (PF) 0.5% -1:200000 IJ SOLN
INTRAMUSCULAR | Status: DC | PRN
  Administered 2016-07-22: 30 mL via PERINEURAL

## 2016-07-22 MED ORDER — HYDROCODONE-ACETAMINOPHEN 5-325 MG PO TABS: 1.0000 | ORAL_TABLET | ORAL | 0 refills | Status: DC | PRN

## 2016-07-22 MED ORDER — ACETAMINOPHEN 500 MG PO TABS
ORAL_TABLET | ORAL | Status: AC
  Filled 2016-07-22: qty 2

## 2016-07-22 MED ORDER — ACETAMINOPHEN 500 MG PO TABS
1000.0000 mg | ORAL_TABLET | ORAL | Status: AC
  Administered 2016-07-22: 1000 mg via ORAL

## 2016-07-22 MED ORDER — CHLORHEXIDINE GLUCONATE CLOTH 2 % EX PADS: 6.0000 | MEDICATED_PAD | Freq: Once | CUTANEOUS | Status: DC

## 2016-07-22 MED ORDER — MIDAZOLAM HCL 5 MG/5ML IJ SOLN
INTRAMUSCULAR | Status: DC | PRN
  Administered 2016-07-22: 0.5 mg via INTRAVENOUS

## 2016-07-22 MED ORDER — LIDOCAINE 2% (20 MG/ML) 5 ML SYRINGE
INTRAMUSCULAR | Status: DC | PRN
  Administered 2016-07-22: 60 mg via INTRAVENOUS

## 2016-07-22 MED ORDER — DEXAMETHASONE SODIUM PHOSPHATE 10 MG/ML IJ SOLN
INTRAMUSCULAR | Status: AC
Start: 2016-07-22 — End: 2016-07-22
  Filled 2016-07-22: qty 1

## 2016-07-22 MED ORDER — FENTANYL CITRATE (PF) 100 MCG/2ML IJ SOLN
50.0000 ug | INTRAMUSCULAR | Status: DC | PRN
  Administered 2016-07-22: 50 ug via INTRAVENOUS

## 2016-07-22 MED ORDER — MIDAZOLAM HCL 2 MG/2ML IJ SOLN
1.0000 mg | INTRAMUSCULAR | Status: DC | PRN
  Administered 2016-07-22: 2 mg via INTRAVENOUS

## 2016-07-22 MED ORDER — CELECOXIB 200 MG PO CAPS
ORAL_CAPSULE | ORAL | Status: AC
  Filled 2016-07-22: qty 2

## 2016-07-22 MED ORDER — GABAPENTIN 300 MG PO CAPS
300.0000 mg | ORAL_CAPSULE | ORAL | Status: AC
  Administered 2016-07-22: 300 mg via ORAL

## 2016-07-22 MED ORDER — LIDOCAINE 2% (20 MG/ML) 5 ML SYRINGE
INTRAMUSCULAR | Status: AC
  Filled 2016-07-22: qty 5

## 2016-07-22 SURGICAL SUPPLY — 49 items
ADH SKN CLS APL DERMABOND .7 (GAUZE/BANDAGES/DRESSINGS) ×2
BINDER BREAST LRG (GAUZE/BANDAGES/DRESSINGS) IMPLANT
BINDER BREAST MEDIUM (GAUZE/BANDAGES/DRESSINGS) ×1 IMPLANT
BINDER BREAST XLRG (GAUZE/BANDAGES/DRESSINGS) IMPLANT
BINDER BREAST XXLRG (GAUZE/BANDAGES/DRESSINGS) IMPLANT
BLADE SURG 15 STRL LF DISP TIS (BLADE) ×1 IMPLANT
BLADE SURG 15 STRL SS (BLADE) ×2
CANISTER SUC SOCK COL 7IN (MISCELLANEOUS) IMPLANT
CANISTER SUCT 1200ML W/VALVE (MISCELLANEOUS) IMPLANT
CHLORAPREP W/TINT 26ML (MISCELLANEOUS) ×2 IMPLANT
CLIP TI WIDE RED SMALL 6 (CLIP) ×1 IMPLANT
COVER BACK TABLE 60X90IN (DRAPES) ×2 IMPLANT
COVER MAYO STAND STRL (DRAPES) ×2 IMPLANT
COVER PROBE W GEL 5X96 (DRAPES) ×2 IMPLANT
DECANTER SPIKE VIAL GLASS SM (MISCELLANEOUS) IMPLANT
DERMABOND ADVANCED (GAUZE/BANDAGES/DRESSINGS) ×2
DERMABOND ADVANCED .7 DNX12 (GAUZE/BANDAGES/DRESSINGS) ×1 IMPLANT
DEVICE DUBIN W/COMP PLATE 8390 (MISCELLANEOUS) ×2 IMPLANT
DRAPE LAPAROSCOPIC ABDOMINAL (DRAPES) ×2 IMPLANT
DRAPE UTILITY XL STRL (DRAPES) ×2 IMPLANT
DRSG PAD ABDOMINAL 8X10 ST (GAUZE/BANDAGES/DRESSINGS) ×1 IMPLANT
ELECT COATED BLADE 2.86 ST (ELECTRODE) ×2 IMPLANT
ELECT REM PT RETURN 9FT ADLT (ELECTROSURGICAL) ×2
ELECTRODE REM PT RTRN 9FT ADLT (ELECTROSURGICAL) ×1 IMPLANT
GLOVE BIOGEL PI IND STRL 8 (GLOVE) ×1 IMPLANT
GLOVE BIOGEL PI INDICATOR 8 (GLOVE) ×1
GLOVE ECLIPSE 7.5 STRL STRAW (GLOVE) ×2 IMPLANT
GOWN STRL REUS W/ TWL LRG LVL3 (GOWN DISPOSABLE) ×1 IMPLANT
GOWN STRL REUS W/ TWL XL LVL3 (GOWN DISPOSABLE) ×1 IMPLANT
GOWN STRL REUS W/TWL LRG LVL3 (GOWN DISPOSABLE) ×2
GOWN STRL REUS W/TWL XL LVL3 (GOWN DISPOSABLE) ×2
ILLUMINATOR WAVEGUIDE N/F (MISCELLANEOUS) IMPLANT
KIT MARKER MARGIN INK (KITS) ×2 IMPLANT
NDL HYPO 25X1 1.5 SAFETY (NEEDLE) ×2 IMPLANT
NDL SAFETY ECLIPSE 18X1.5 (NEEDLE) ×1 IMPLANT
NEEDLE HYPO 18GX1.5 SHARP (NEEDLE) ×2
NEEDLE HYPO 25X1 1.5 SAFETY (NEEDLE) ×4 IMPLANT
NS IRRIG 1000ML POUR BTL (IV SOLUTION) IMPLANT
PACK BASIN DAY SURGERY FS (CUSTOM PROCEDURE TRAY) ×2 IMPLANT
PENCIL BUTTON HOLSTER BLD 10FT (ELECTRODE) ×2 IMPLANT
SLEEVE SCD COMPRESS KNEE MED (MISCELLANEOUS) ×2 IMPLANT
SPONGE LAP 4X18 X RAY DECT (DISPOSABLE) ×2 IMPLANT
SUT MON AB 5-0 PS2 18 (SUTURE) ×2 IMPLANT
SUT VICRYL 3-0 CR8 SH (SUTURE) ×3 IMPLANT
SYR CONTROL 10ML LL (SYRINGE) ×4 IMPLANT
TOWEL OR 17X24 6PK STRL BLUE (TOWEL DISPOSABLE) ×2 IMPLANT
TOWEL OR NON WOVEN STRL DISP B (DISPOSABLE) ×2 IMPLANT
TUBE CONNECTING 20X1/4 (TUBING) ×1 IMPLANT
YANKAUER SUCT BULB TIP NO VENT (SUCTIONS) ×1 IMPLANT

## 2016-07-22 NOTE — Anesthesia Procedure Notes (Signed)
Procedure Name: LMA Insertion Date/Time: 07/22/2016 9:37 AM Performed by: Suella Broad D Pre-anesthesia Checklist: Patient identified, Emergency Drugs available, Suction available and Patient being monitored Patient Re-evaluated:Patient Re-evaluated prior to inductionOxygen Delivery Method: Circle system utilized Preoxygenation: Pre-oxygenation with 100% oxygen Intubation Type: IV induction Ventilation: Mask ventilation without difficulty LMA: LMA inserted LMA Size: 4.0 Number of attempts: 1 Airway Equipment and Method: Bite block Placement Confirmation: positive ETCO2 Tube secured with: Tape Dental Injury: Teeth and Oropharynx as per pre-operative assessment

## 2016-07-22 NOTE — Anesthesia Postprocedure Evaluation (Addendum)
Anesthesia Post Note  Patient: Jenna Lowe  Procedure(s) Performed: Procedure(s) (LRB): BREAST LUMPECTOMY WITH RADIOACTIVE SEED AND SENTINEL LYMPH NODE BIOPSY (Left)  Patient location during evaluation: PACU Anesthesia Type: General Level of consciousness: awake and alert Pain management: pain level controlled Vital Signs Assessment: post-procedure vital signs reviewed and stable Respiratory status: spontaneous breathing, nonlabored ventilation, respiratory function stable and patient connected to nasal cannula oxygen Cardiovascular status: blood pressure returned to baseline and stable Postop Assessment: no signs of nausea or vomiting Anesthetic complications: no       Last Vitals:  Vitals:   07/22/16 1115 07/22/16 1148  BP: 125/67 (!) 150/75  Pulse: 91 81  Resp: 14 18  Temp:  36.4 C    Last Pain:  Vitals:   07/22/16 1148  TempSrc: Oral  PainSc: 0-No pain                 Effie Berkshire

## 2016-07-22 NOTE — Discharge Instructions (Signed)
[facility] °Office Phone Number [phone] ° °BREAST BIOPSY/ PARTIAL MASTECTOMY: POST OP INSTRUCTIONS ° °Always review your discharge instruction sheet given to you by the facility where your surgery was performed. ° °IF YOU HAVE DISABILITY OR FAMILY LEAVE FORMS, YOU MUST BRING THEM TO THE OFFICE FOR PROCESSING.  DO NOT GIVE THEM TO YOUR DOCTOR. ° °1. A prescription for pain medication may be given to you upon discharge.  Take your pain medication as prescribed, if needed.  If narcotic pain medicine is not needed, then you may take acetaminophen (Tylenol) or ibuprofen (Advil) as needed. °2. Take your usually prescribed medications unless otherwise directed °3. If you need a refill on your pain medication, please contact your pharmacy.  They will contact our office to request authorization.  Prescriptions will not be filled after 5pm or on week-ends. °4. You should eat very light the first 24 hours after surgery, such as soup, crackers, pudding, etc.  Resume your normal diet the day after surgery. °5. Most patients will experience some swelling and bruising in the breast.  Ice packs and a good support bra will help.  Swelling and bruising can take several days to resolve.  °6. It is common to experience some constipation if taking pain medication after surgery.  Increasing fluid intake and taking a stool softener will usually help or prevent this problem from occurring.  A mild laxative (Milk of Magnesia or Miralax) should be taken according to package directions if there are no bowel movements after 48 hours. °7. Unless discharge instructions indicate otherwise, you may remove your bandages 24-48 hours after surgery, and you may shower at that time.  You may have steri-strips (small skin tapes) in place directly over the incision.  These strips should be left on the skin for 7-10 days.  If your surgeon used skin glue on the incision, you may shower in 24 hours.  The glue will flake off over the  next 2-3 weeks.  Any sutures or staples will be removed at the office during your follow-up visit. °8. ACTIVITIES:  You may resume regular daily activities (gradually increasing) beginning the next day.  Wearing a good support bra or sports bra minimizes pain and swelling.  You may have sexual intercourse when it is comfortable. °a. You may drive when you no longer are taking prescription pain medication, you can comfortably wear a seatbelt, and you can safely maneuver your car and apply brakes. °b. RETURN TO WORK:  ______________________________________________________________________________________ °9. You should see your doctor in the office for a follow-up appointment approximately two weeks after your surgery.  Your doctor’s nurse will typically make your follow-up appointment when she calls you with your pathology report.  Expect your pathology report 2-3 business days after your surgery.  You may call to check if you do not hear from us after three days. °10. OTHER INSTRUCTIONS: _______________________________________________________________________________________________ _____________________________________________________________________________________________________________________________________ °_____________________________________________________________________________________________________________________________________ °_____________________________________________________________________________________________________________________________________ ° °WHEN TO CALL YOUR DOCTOR: °1. Fever over 101.0 °2. Nausea and/or vomiting. °3. Extreme swelling or bruising. °4. Continued bleeding from incision. °5. Increased pain, redness, or drainage from the incision. ° °The clinic staff is available to answer your questions during regular business hours.  Please don’t hesitate to call and ask to speak to one of the nurses for clinical concerns.  If you have a medical emergency, go to the nearest  emergency room or call 911.  A surgeon from [facility] is always on call at the hospital. ° °For further questions, please visit [email or web address]  °

## 2016-07-22 NOTE — Transfer of Care (Signed)
Last Vitals:  Vitals:   07/22/16 0900 07/22/16 0915  BP: (!) 92/52 (!) 103/54  Pulse: 79 84  Resp: 15 (!) 22  Temp:      Last Pain:  Vitals:   07/22/16 0811  TempSrc: Oral        Immediate Anesthesia Transfer of Care Note  Patient: Jenna Lowe  Procedure(s) Performed: Procedure(s) (LRB): BREAST LUMPECTOMY WITH RADIOACTIVE SEED AND SENTINEL LYMPH NODE BIOPSY (Left)  Patient Location: PACU  Anesthesia Type: General  Level of Consciousness: awake, alert  and oriented  Airway & Oxygen Therapy: Patient Spontanous Breathing and Patient connected to face mask oxygen  Post-op Assessment: Report given to PACU RN and Post -op Vital signs reviewed and stable  Post vital signs: Reviewed and stable  Complications: No apparent anesthesia complications

## 2016-07-22 NOTE — Progress Notes (Signed)
Assisted nuc med tech # [id number] with nuc med inj. Side rails up, monitors on throughout procedure. See vital signs in flow sheet. Tolerated Procedure well. 

## 2016-07-22 NOTE — Anesthesia Procedure Notes (Signed)
Anesthesia Regional Block: Pectoralis block   Pre-Anesthetic Checklist: ,, timeout performed, Correct Patient, Correct Site, Correct Laterality, Correct Procedure, Correct Position, site marked, Risks and benefits discussed,  Surgical consent,  Pre-op evaluation,  At surgeon's request and post-op pain management  Laterality: Left  Prep: chloraprep       Needles:  Injection technique: Single-shot  Needle Type: Echogenic Needle     Needle Length: 9cm  Needle Gauge: 21     Additional Needles:   Procedures: ultrasound guided,,,,,,,,  Narrative:  Start time: 07/22/2016 8:50 AM End time: 07/22/2016 8:55 AM Injection made incrementally with aspirations every 5 mL.  Performed by: Personally  Anesthesiologist: Suella Broad D  Additional Notes: Pt tolerated well.

## 2016-07-22 NOTE — Anesthesia Preprocedure Evaluation (Addendum)
Anesthesia Evaluation  Patient identified by MRN, date of birth, ID band Patient awake    Reviewed: Allergy & Precautions, NPO status , Patient's Chart, lab work & pertinent test results  Airway Mallampati: II  TM Distance: >3 FB Neck ROM: Full    Dental  (+) Dental Advisory Given   Pulmonary Current Smoker,    breath sounds clear to auscultation       Cardiovascular hypertension, Pt. on medications  Rhythm:Regular Rate:Normal     Neuro/Psych negative neurological ROS  negative psych ROS   GI/Hepatic negative GI ROS, Neg liver ROS,   Endo/Other  negative endocrine ROS  Renal/GU negative Renal ROS  negative genitourinary   Musculoskeletal negative musculoskeletal ROS (+)   Abdominal   Peds negative pediatric ROS (+)  Hematology negative hematology ROS (+)   Anesthesia Other Findings   Reproductive/Obstetrics negative OB ROS                            Anesthesia Physical Anesthesia Plan  ASA: II  Anesthesia Plan: General   Post-op Pain Management: GA combined w/ Regional for post-op pain   Induction: Intravenous  Airway Management Planned: LMA  Additional Equipment:   Intra-op Plan:   Post-operative Plan: Extubation in OR  Informed Consent: I have reviewed the patients History and Physical, chart, labs and discussed the procedure including the risks, benefits and alternatives for the proposed anesthesia with the patient or authorized representative who has indicated his/her understanding and acceptance.   Dental advisory given  Plan Discussed with: CRNA  Anesthesia Plan Comments:         Anesthesia Quick Evaluation

## 2016-07-22 NOTE — Interval H&P Note (Signed)
History and Physical Interval Note:  07/22/2016 9:25 AM  Jenna Lowe  has presented today for surgery, with the diagnosis of LEFT BREAST CANCER  The various methods of treatment have been discussed with the patient and family. After consideration of risks, benefits and other options for treatment, the patient has consented to  Procedure(s): BREAST LUMPECTOMY WITH RADIOACTIVE SEED AND SENTINEL LYMPH NODE BIOPSY (Left) as a surgical intervention .  The patient's history has been reviewed, patient examined, no change in status, stable for surgery.  I have reviewed the patient's chart and labs.  Questions were answered to the patient's satisfaction.     [name] T

## 2016-07-22 NOTE — Progress Notes (Signed)
Assisted Dr. Hollis with left, ultrasound guided, pectoralis block. Side rails up, monitors on throughout procedure. See vital signs in flow sheet. Tolerated Procedure well. 

## 2016-07-22 NOTE — Op Note (Addendum)
Preoperative Diagnosis: LEFT BREAST CANCER  Postoprative Diagnosis: LEFT BREAST CANCER  Procedure: Procedure(s): Blue dye injection left breast, left BREAST LUMPECTOMY WITH RADIOACTIVE SEED AND LEFT DEEP AXILLARY SENTINEL LYMPH NODE BIOPSY   Surgeon: Excell Seltzer T   Assistants: None  Anesthesia:  General LMA anesthesia  Indications: Patient is a 71 year old female with a recent diagnosis of stage IA invasive ductal carcinoma of the lower outer left breast. After preoperative workup and discussion detailed elsewhere we have elected to proceed with radioactive seed localized left breast lumpectomy and left axillary sentinel lymph node biopsy is initial surgical treatment.    Procedure Detail: Patient had previously undergone accurate placement of a radioactive seed at the tumor a clip site in the lower outer left breast. See placement was confirmed with the neoprobe in the holding area. She underwent injection of 1 mCi of technetium sulfur colloid intradermally around the left nipple in the holding area and underwent a pectoral block by anesthesia. She was taken to the operating room, placed in the supine position on the operating table, and laryngeal mask general anesthesia induced. She was carefully positioned with her left arm extended. Under sterile technique after patient timeout I injected 5 mL of dilute methylene blue subcutaneously beneath the left nipple and massaged this for several minutes. Following this the entire left breast axilla and upper arm were widely sterilely prepped and draped. She had received preoperative IV antibiotics. PAS were in place. Patient timeout was again performed and correct procedure verified. The seed was localized with the neoprobe. There was some skin thickening in the tumor position appeared fairly superficial. I elliptically excised a small amount of skin overlying the seed and tumor site and then dissection was carried out into the subcutaneous tissue  and widened in all directions approaching the breast capsule. Using the neoprobe for guidance a generous, approximate 4 cm lumpectomy was performed around the seed. The specimen was inked for margins and specimen x-ray showed the seed and marking clip centrally located within the specimen. This was sent for permanent pathology. Complete hemostasis was obtained in this wound and it was marked with clips. The breast and subcutaneous tissue was closed with interrupted 3-0 Vicryl. Attention was turned to the sentinel lymph node. There was an area of moderately high counts in the axilla and a small transverse incision was made over this. Dissection was carried down through the subcutaneous tissue with cautery and the clavipectoral fascia incised entering the deep axilla. Using the neoprobe for guidance using blunt dissection I dissected down upon a normal-sized deep lymph node with blue dye and elevated counts. This was completely excised with cautery with probably a small adjacent node. Ex vivo the node had counts in excess of 200. This was sent as hot blue left axillary sentinel lymph node. At this point I could not obtain any significant counts in the axilla, did not see any blue dye and there was no palpable adenopathy. Hemostasis was assured and the deep axillary and subtenon's tissue closed with interrupted 3-0 Vicryl. Both skin incisions were closed with subcutaneous #5-0 Monocryl and Dermabond. Sponge and needle and instrument counts were correct.    Findings: As above  Estimated Blood Loss:  Minimal         Drains: None  Blood Given: none          Specimens: #1 left breast lumpectomy   #2 left axillary sentinel lymph node        Complications:  * No complications entered in  OR log *         Disposition: PACU - hemodynamically stable.         Condition: stable

## 2016-07-23 ENCOUNTER — Encounter (HOSPITAL_BASED_OUTPATIENT_CLINIC_OR_DEPARTMENT_OTHER): Payer: Self-pay | Admitting: General Surgery

## 2016-07-23 NOTE — Addendum Note (Signed)
Addendum  created 07/23/16 1002 by Ernesta Amble Kaye Mitro, CRNA   Charge Capture section accepted

## 2016-07-29 ENCOUNTER — Encounter: Payer: Self-pay | Admitting: Hematology and Oncology

## 2016-07-29 ENCOUNTER — Telehealth: Payer: Self-pay | Admitting: *Deleted

## 2016-07-29 NOTE — Telephone Encounter (Signed)
Discussed importance for pt to see medical oncologist. Informed pt on difference b/t medical and radiation oncologist. After this discussion, pt agree to see medical oncology. Gave appt date and time of 3/12 at 1115. Pt confirm appt. Denies further questions.  Gave navigation resources and contact information.

## 2016-08-04 ENCOUNTER — Ambulatory Visit: Payer: Self-pay | Admitting: Radiation Oncology

## 2016-08-05 NOTE — Progress Notes (Signed)
FOLLOW UP NEW CONSULT BREAST  Diagnosis 07/22/2016: Dr. Excell Seltzer, MD and follow up this Friday 08/15/16 1. Breast, lumpectomy, Left - INVASIVE DUCTAL CARCINOMA WITH CALCIFICATIONS, GRADE I/III, SPANNING 0.4 CM. - DUCTAL CARCINOMA IN SITU, LOW GRADE. - THE SURGICAL RESECTION MARGINS ARE NEGATIVE FOR CARCINOMA. - SEE ONCOLOGY TABLE BELOW. 2. Lymph node, sentinel, biopsy, Left axillary - THERE IS NO EVIDENCE OF CARCINOMA IN 1 OF 1 LYMPH NODE (0/1).  Occasional sharp pains in right breast from surgery,   Incision under axilla and lower lateral breat well healed, has glue on both  Appt with Dr. Lindi Adie, MD 3/12/218 @ 11:15am  Lost her sister  Last Tuesday 08/04/16 very tearful   BP (!) 152/72 (BP Location: Right Arm, Patient Position: Sitting, Cuff Size: Normal)   Pulse 100   Temp 97.7 F (36.5 C) (Oral)   Resp 16   Ht 5\' 3"  (1.6 m)   Wt 119 lb 6.4 oz (54.2 kg)   BMI 21.15 kg/m   Wt Readings from Last 3 Encounters:  08/11/16 119 lb 6.4 oz (54.2 kg)  07/22/16 119 lb 8 oz (54.2 kg)  07/17/16 119 lb 1.6 oz (54 kg)

## 2016-08-11 ENCOUNTER — Ambulatory Visit
Admission: RE | Admit: 2016-08-11 | Discharge: 2016-08-11 | Disposition: A | Discharge status: Home / Self Care | Payer: Medicare Other | Source: Ambulatory Visit | Attending: Radiation Oncology | Admitting: Radiation Oncology

## 2016-08-11 ENCOUNTER — Ambulatory Visit (HOSPITAL_BASED_OUTPATIENT_CLINIC_OR_DEPARTMENT_OTHER): Payer: Medicare Other | Admitting: Hematology and Oncology

## 2016-08-11 ENCOUNTER — Encounter: Payer: Self-pay | Admitting: Radiation Oncology

## 2016-08-11 ENCOUNTER — Encounter: Payer: Self-pay | Admitting: Hematology and Oncology

## 2016-08-11 ENCOUNTER — Encounter: Payer: Self-pay | Admitting: *Deleted

## 2016-08-11 DIAGNOSIS — C50212 Malignant neoplasm of upper-inner quadrant of left female breast: Secondary | ICD-10-CM

## 2016-08-11 DIAGNOSIS — Z510 Encounter for antineoplastic radiation therapy: Secondary | ICD-10-CM | POA: Diagnosis not present

## 2016-08-11 DIAGNOSIS — Z170 Estrogen receptor positive status [ER+]: Principal | ICD-10-CM

## 2016-08-11 DIAGNOSIS — Z801 Family history of malignant neoplasm of trachea, bronchus and lung: Secondary | ICD-10-CM | POA: Diagnosis not present

## 2016-08-11 DIAGNOSIS — Z720 Tobacco use: Secondary | ICD-10-CM | POA: Diagnosis not present

## 2016-08-11 MED ORDER — HYDROCHLOROTHIAZIDE 12.5 MG PO CAPS: 6.2500 mg | ORAL_CAPSULE | Freq: Every day | ORAL | Status: AC

## 2016-08-11 NOTE — Progress Notes (Signed)
[facility] CONSULT NOTE  No care team member to display  CHIEF COMPLAINTS/PURPOSE OF CONSULTATION:  Newly diagnosed breast cancer  HISTORY OF PRESENTING ILLNESS:  Jenna Lowe 71 y.o. female is here because of recent diagnosis of left breast cancer that was detected on a routine 3-D mammogram by her primary care physician. This led to additional investigations including ultrasound guided biopsy which revealed invasive ductal carcinoma. She underwent surgery with a left lumpectomy on 07/22/2016. 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 The margins are negative and one lymph node was also negative. The tumor was estrogen progesterone receptor positive and HER-2 negative with a Ki-67 of 10%. She was presented to the breast tumor board and she is here today to meet with radiation oncology and me. She is from [place]  I reviewed her records extensively and collaborated the history with the patient.  SUMMARY OF ONCOLOGIC HISTORY:   Malignant neoplasm of upper-inner quadrant of left breast in female, estrogen receptor positive ([place])   07/03/2016 Initial Diagnosis    Left breast biopsy mid breast: IDC, grade 1 with DCIS and calcifications, ER 100%, PR 30%, Ki-67 10%, HER-2 negative ratio 1.19      07/22/2016 Surgery    Left lumpectomy: IDC with calcifications, grade 1, 0.4 cm, with low-grade DCIS, margins negative, 0/1 lymph node negative, ER 100%, PR 30%, HER-2 negative ratio 1.19, Ki-67 10%, T1aN0 stage IA      MEDICAL HISTORY:  Past Medical History:  Diagnosis Date  . Breast cancer of upper-outer quadrant of left female breast ([place])   . H/O seasonal allergies   . Hypertension     SURGICAL HISTORY: Past Surgical History:  Procedure Laterality Date  . BREAST LUMPECTOMY WITH RADIOACTIVE SEED AND SENTINEL LYMPH NODE BIOPSY Left 07/22/2016   Procedure: BREAST LUMPECTOMY WITH  RADIOACTIVE SEED AND SENTINEL LYMPH NODE BIOPSY;  Surgeon: Excell Seltzer, MD;  Location: [facility];  Service: General;  Laterality: Left;  . NO PAST SURGERIES      SOCIAL HISTORY: Social History   Social History  . Marital status: Widowed    Spouse name: N/A  . Number of children: N/A  . Years of education: N/A   Occupational History  . Not on file.   Social History Main Topics  . Smoking status: Current Every Day Smoker    Packs/day: 1.00    Types: Cigarettes  . Smokeless tobacco: Never Used  . Alcohol use No  . Drug use: No  . Sexual activity: Not on file   Other Topics Concern  . Not on file   Social History Narrative  . No narrative on file    FAMILY HISTORY: Family History  Problem Relation Age of Onset  . Lung cancer Sister     ALLERGIES:  is allergic to pseudoephedrine; sulfa antibiotics; and augmentin [amoxicillin-pot clavulanate].  MEDICATIONS:  Current Outpatient Prescriptions  Medication Sig Dispense Refill  . acetaminophen (TYLENOL) 500 MG chewable tablet Chew 500 mg by mouth every 6 (six) hours as needed for pain.    Marland Kitchen aspirin 81 MG tablet Take 81 mg by mouth daily.    . cetirizine (ZYRTEC) 10 MG chewable tablet Chew 10 mg by mouth daily.    . fluticasone (FLONASE) 50 MCG/ACT nasal spray Place into both nostrils daily.    . hydrochlorothiazide (MICROZIDE) 12.5 MG capsule Take 1 capsule (12.5 mg total) by mouth daily.    Marland Kitchen  ibuprofen (ADVIL,MOTRIN) 200 MG tablet Take 200 mg by mouth every 8 (eight) hours as needed for mild pain.    Marland Kitchen lisinopril (PRINIVIL,ZESTRIL) 20 MG tablet Take 20 mg by mouth daily.    . Multiple Vitamins-Minerals (MULTIVITAMIN WITH MINERALS) tablet Take 1 tablet by mouth daily.    . Omega-3 Fat Ac-Cholecalciferol (MINICAPS VITAMIN-D/OMEGA-3 PO) Take 1 capsule by mouth daily.     No current facility-administered medications for this visit.     REVIEW OF SYSTEMS:   Constitutional: Denies fevers, chills or  abnormal night sweats Eyes: Denies blurriness of vision, double vision or watery eyes Ears, nose, mouth, throat, and face: Denies mucositis or sore throat Respiratory: Denies cough, dyspnea or wheezes Cardiovascular: Denies palpitation, chest discomfort or lower extremity swelling Gastrointestinal:  Denies nausea, heartburn or change in bowel habits Skin: Denies abnormal skin rashes Lymphatics: Denies new lymphadenopathy or easy bruising Neurological:Denies numbness, tingling or new weaknesses Behavioral/Psych: Mood is stable, no new changes  Breast:  Left lumpectomy denies any pain All other systems were reviewed with the patient and are negative.  PHYSICAL EXAMINATION: ECOG PERFORMANCE STATUS: 1 - Symptomatic but completely ambulatory  Vitals:   08/11/16 1056  BP: (!) 147/79  Pulse: 85  Resp: 17  Temp: 97.7 F (36.5 C)   Filed Weights   08/11/16 1056  Weight: 119 lb (54 kg)    GENERAL:alert, no distress and comfortable SKIN: skin color, texture, turgor are normal, no rashes or significant lesions EYES: normal, conjunctiva are pink and non-injected, sclera clear OROPHARYNX:no exudate, no erythema and lips, buccal mucosa, and tongue normal  NECK: supple, thyroid normal size, non-tender, without nodularity LYMPH:  no palpable lymphadenopathy in the cervical, axillary or inguinal LUNGS: clear to auscultation and percussion with normal breathing effort HEART: regular rate & rhythm and no murmurs and no lower extremity edema ABDOMEN:abdomen soft, non-tender and normal bowel sounds Musculoskeletal:no cyanosis of digits and no clubbing  PSYCH: alert & oriented x 3 with fluent speech NEURO: no focal motor/sensory deficits  LABORATORY DATA:  I have reviewed the data as listed Lab Results  Component Value Date   HGB 13.3 07/22/2016   HCT 39.0 07/22/2016   Lab Results  Component Value Date   NA 143 07/22/2016   K 3.7 07/22/2016   CL 105 07/22/2016    RADIOGRAPHIC  STUDIES: I have personally reviewed the radiological reports and agreed with the findings in the report.  ASSESSMENT AND PLAN:  Malignant neoplasm of upper-inner quadrant of left breast in female, estrogen receptor positive ([place]) Right lumpectomy 07/22/2016: IDC with calcifications, grade 1, 0.4 cm, with low-grade DCIS, margins negative, 0/1 lymph node negative, ER 100%, PR 30%, HER-2 negative ratio 1.19, Ki-67 10%, T1aN0 stage IA  Pathology counseling: I discussed the final pathology report of the patient provided  a copy of this report. I discussed the margins as well as lymph node surgeries. We also discussed the final staging along with previously performed ER/PR and HER-2/neu testing.  Recommendation: 1. Adjuvant radiation therapy 2. followed by adjuvant antiestrogen therapy with letrozole 2.5 mg daily  Letrozole counseling:We discussed the risks and benefits of anti-estrogen therapy with aromatase inhibitors. These include but not limited to insomnia, hot flashes, mood changes, vaginal dryness, bone density loss, and weight gain. We strongly believe that the benefits far outweigh the risks. Patient understands these risks and consented to starting treatment. Planned treatment duration is 5 years.  Patient had recently lost her sister to stage IV lung cancer and is grieving  from it. Return to clinic after radiation is complete to start antiestrogen therapy.   All questions were answered. The patient knows to call the clinic with any problems, questions or concerns.    Rulon Eisenmenger, MD 08/11/16

## 2016-08-11 NOTE — Assessment & Plan Note (Signed)
Right lumpectomy 07/22/2016: IDC with calcifications, grade 1, 0.4 cm, with low-grade DCIS, margins negative, 0/1 lymph node negative, ER 100%, PR 30%, HER-2 negative ratio 1.19, Ki-67 10%, T1aN0 stage IA  Pathology counseling: I discussed the final pathology report of the patient provided  a copy of this report. I discussed the margins as well as lymph node surgeries. We also discussed the final staging along with previously performed ER/PR and HER-2/neu testing.  Recommendation: 1. Adjuvant radiation therapy 2. followed by adjuvant antiestrogen therapy with letrozole 2.5 mg daily  Return to clinic after radiation is complete to start antiestrogen therapy.

## 2016-08-11 NOTE — Progress Notes (Addendum)
Attestation Please see the note from Shona Simpson, PA-C from today's visit for more details of today's encounter.  I have personally performed a face to face diagnostic evaluation on this patient and devised the following assessment and plan.  The patient is status post lumpectomy and is a good candidate for a hypo-fractionated course of adjuvant radiation to the left breast. The patient does wish to proceed with this treatment and all of her questions were answered today. She is doing well after surgery and will undergo simulation later this week.   Kyung Rudd, MD      Radiation Oncology         [phone] ________________________________  Name: ARIANI SEIER MRN: [id number]  Date: 08/11/2016  DOB: [birth year]  CC:No primary care provider on file.  Excell Seltzer, MD     FOLLOW-UP NEW  REFERRING PHYSICIAN: Excell Seltzer, MD  DIAGNOSIS: The encounter diagnosis was Malignant neoplasm of upper-inner quadrant of left breast in female, estrogen receptor positive ([place]). 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 female seen in follow up for a left breast cancer. The patient has been followed closely with 6 month mammograms since November 2015 for a right breast questionable density believed to be benign mildly asymmetric fibroglandular tissue and scattered benign-appearing calcifications. Diagnostic bilateral mammogram and ultrasound on 06/30/16 showed a previously noted 0.4 cm group of coarse calcifications in the right breast unchanged from her December 2016 exam, an area of distortion in the posterior left breast measuring about 0.6 cm, and no left axillary adenopathy. The patient had biopsy of the left mid breast on 07/03/16 revealing grade 1 invasive ductal carcinoma and DCIS with calcifications (ER 100% +, PR 30% +, HER2 -, Ki67 10%). The patient then proceeded with biopsy of  the UIQ of the right breast on 07/07/16 revealing fibroadnoma with calcifications, adenosis with calcifications, and no evidence of malignancy.   The patient underwent a left breast lumpectomy on 07/22/16. This revealed invasive ductal carcinoma with calcifications, grade I/III, spanning 0.4 cm. There was also DCIS low grade. Surgical resection margins were negative for carcinoma. Biopsy of the sentinel lymph node showed no evidence of carcinoma (0/1). The patient will follow up with Dr. Excell Seltzer on 08/15/16.  PREVIOUS RADIATION THERAPY: No   PAST MEDICAL HISTORY:  Past Medical History:  Diagnosis Date  . Breast cancer of upper-outer quadrant of left female breast ([place])   . H/O seasonal allergies   . Hypertension        PAST SURGICAL HISTORY: Past Surgical History:  Procedure Laterality Date  . BREAST LUMPECTOMY WITH RADIOACTIVE SEED AND SENTINEL LYMPH NODE BIOPSY Left 07/22/2016   Procedure: BREAST LUMPECTOMY WITH RADIOACTIVE SEED AND SENTINEL LYMPH NODE BIOPSY;  Surgeon: Excell Seltzer, MD;  Location: [facility];  Service: General;  Laterality: Left;  . NO PAST SURGERIES       FAMILY HISTORY:  Family History  Problem Relation Age of Onset  . Lung cancer Sister      SOCIAL HISTORY:  reports that she has been smoking Cigarettes.  She has been smoking about 1.00 pack per day. She has never used smokeless tobacco. She reports that she does not drink alcohol or use drugs. She lives in [place], and plans to continue her care in [place].   ALLERGIES: Pseudoephedrine; Sulfa antibiotics; and Augmentin [amoxicillin-pot clavulanate]   MEDICATIONS:  Current Outpatient Prescriptions  Medication Sig  Dispense Refill  . acetaminophen (TYLENOL) 500 MG chewable tablet Chew 500 mg by mouth every 6 (six) hours as needed for pain.    Marland Kitchen aspirin 81 MG tablet Take 81 mg by mouth daily.    . cetirizine (ZYRTEC) 10 MG chewable tablet Chew 10 mg by mouth daily.    . fluticasone  (FLONASE) 50 MCG/ACT nasal spray Place into both nostrils daily.    . hydrochlorothiazide (MICROZIDE) 12.5 MG capsule Take 12.5 mg by mouth daily.    Marland Kitchen ibuprofen (ADVIL,MOTRIN) 200 MG tablet Take 200 mg by mouth every 8 (eight) hours as needed for mild pain.    Marland Kitchen lisinopril (PRINIVIL,ZESTRIL) 20 MG tablet Take 20 mg by mouth daily.    . Multiple Vitamins-Minerals (MULTIVITAMIN WITH MINERALS) tablet Take 1 tablet by mouth daily.    . Omega-3 Fat Ac-Cholecalciferol (MINICAPS VITAMIN-D/OMEGA-3 PO) Take 1 capsule by mouth daily.     No current facility-administered medications for this encounter.      REVIEW OF SYSTEMS: On review of systems, the patient reports that she is doing well overall. She reports occasional sharp pains and tenderness in her right breast from surgery. She also notes the right breast feels heavy. She denies any chest pain, shortness of breath, cough, fevers, chills, night sweats, unintended weight changes. She denies any bowel or bladder disturbances, and denies abdominal pain, nausea or vomiting. Of note, patient's sister passed away from a new diagnosis of advanced lung cancer on 08/04/16. She is very tearful and understandably very upset. She denies any new musculoskeletal or joint aches or pains. A complete review of systems is obtained and is otherwise negative.   PHYSICAL EXAM:  Wt Readings from Last 3 Encounters:  08/11/16 119 lb 6.4 oz (54.2 kg)  07/22/16 119 lb 8 oz (54.2 kg)  07/17/16 119 lb 1.6 oz (54 kg)   Temp Readings from Last 3 Encounters:  08/11/16 97.7 F (36.5 C) (Oral)  07/22/16 97.5 F (36.4 C) (Oral)  07/17/16 98.2 F (36.8 C) (Oral)   BP Readings from Last 3 Encounters:  08/11/16 (!) 152/72  07/22/16 (!) 150/75  07/17/16 (!) 101/54   Pulse Readings from Last 3 Encounters:  08/11/16 100  07/22/16 81  07/17/16 (!) 103   Pain Assessment Pain Score: 2  Pain Loc: Breast (sore)/10  In general this is a well appearing caucasian woman in no  acute distress. She's alert and oriented x4 and appropriate throughout the examination. Cardiopulmonary assessment is negative for acute distress and she exhibits normal effort. Minimal erythema noted in the lower inner quadrant of the left breast without palpable heat. This does not appear cellulitic in nature. She has a well healed lower outer quadrant incision and axillary incision that are both intact. No chest wall or left upper extremity edema is noted.  LABORATORY DATA:  Lab Results  Component Value Date   HGB 13.3 07/22/2016   HCT 39.0 07/22/2016   Lab Results  Component Value Date   NA 143 07/22/2016   K 3.7 07/22/2016   CL 105 07/22/2016   No results found for: ALT, AST, GGT, ALKPHOS, BILITOT    RADIOGRAPHY: Mm Breast Surgical Specimen  Result Date: 07/22/2016 CLINICAL DATA:  Patient with left breast cancer status post lumpectomy earlier today after earlier radioactive seed localization. EXAM: SPECIMEN RADIOGRAPH OF THE LEFT BREAST COMPARISON:  Previous exam(s). FINDINGS: Status post excision of the left breast. The radioactive seed and biopsy marker clip are present, completely intact, and were marked for pathology.  The positions of the radioactive seed and biopsy marker clip within the specimen were discussed with the OR staff during the procedure. IMPRESSION: Specimen radiograph of the left breast. Electronically Signed   By: Franki Cabot M.D.   On: 07/22/2016 10:25   Mm Lt Radioactive Seed Loc Mammo Guide  Result Date: 07/21/2016 CLINICAL DATA:  71 year old female for seed localization of a known left breast carcinoma EXAM: MAMMOGRAPHIC GUIDED RADIOACTIVE SEED LOCALIZATION OF THE LEFT BREAST COMPARISON:  Previous exam(s). FINDINGS: Patient presents for radioactive seed localization prior to left lumpectomy. I met with the patient and we discussed the procedure of seed localization including benefits and alternatives. We discussed the high likelihood of a successful procedure. We  discussed the risks of the procedure including infection, bleeding, tissue injury and further surgery. We discussed the low dose of radioactivity involved in the procedure. Informed, written consent was given. The usual time-out protocol was performed immediately prior to the procedure. Using mammographic guidance, sterile technique, 1% lidocaine and an I-125 radioactive seed, the coil shaped biopsy marker in the lateral left breast was localized using a lateral to medial approach. The follow-up mammogram images confirm the seed in the expected location and were marked for Dr. Excell Seltzer. Follow-up survey of the patient confirms presence of the radioactive seed. Order number of I-125 seed:  [id number]. Total activity:  0.249 mCi  reference Date: 07/15/2016 The patient tolerated the procedure well and was released from the [facility]. She was given instructions regarding seed removal. IMPRESSION: Radioactive seed localization of the left breast. No apparent complications. Electronically Signed   By: Pamelia Hoit M.D.   On: 07/21/2016 15:16       IMPRESSION/PLAN: 1. Stage IA cT1bN0, ER/PR positive, grade 1 invasive ductal carcinoma of the left breast. Dr. Lisbeth Renshaw discusses the pathology findings and reviews the nature of invasive breast disease. We reviewed her pathology from lumpectomy and fortunately the small size is quite favorable, and we don't believe there is a need for chemotherapy, however that will be formally determined by Dr. Lindi Adie when she meets with him today. Provided that chemotherapy is not indicated, the patient's course will now include external radiotherapy to the breast followed by antiestrogen therapy. We discussed the risks, benefits, short, and long term effects of radiotherapy, and the patient is interested in proceeding. Dr. Lisbeth Renshaw discusses the delivery and logistics of radiotherapy and he again believes she is a good candidate for a course of 4 weeks.  We will proceed with CT simulation  and treatment planning for 1 pm on 08/15/16. She will also see Dr. Excell Seltzer on 08/15/16 at 10am. 2. Grief of recent loss. The patient was given emotional support, and we will follow up with her regarding this.  In a visit lasting 30 minutes, greater than 50% of the time was spent face to face discussing her pathology, recent loss of her sister, and coordinating the patient's care.   The above documentation reflects my direct findings during this shared patient visit. Please see the separate note by Dr. Lisbeth Renshaw on this date for the remainder of the patient's plan of care.    Carola Rhine, PAC  This document serves as a record of services personally performed by Shona Simpson, PA-C and Kyung Rudd, MD. It was created on their behalf by Bethann Humble, a trained medical scribe. The creation of this record is based on the scribe's personal observations and the providers' statements to them. This document has been checked and approved by the attending  provider.

## 2016-08-11 NOTE — Progress Notes (Signed)
Please see the Nurse Progress Note in the MD Initial Consult Encounter for this patient. 

## 2016-08-14 NOTE — Addendum Note (Signed)
Encounter addended by: Kyung Rudd, MD on: 08/14/2016  7:52 AM<BR>    Actions taken: Sign clinical note

## 2016-08-15 ENCOUNTER — Ambulatory Visit
Admission: RE | Admit: 2016-08-15 | Discharge: 2016-08-15 | Disposition: A | Discharge status: Home / Self Care | Payer: Medicare Other | Source: Ambulatory Visit | Attending: Radiation Oncology | Admitting: Radiation Oncology

## 2016-08-15 DIAGNOSIS — C50212 Malignant neoplasm of upper-inner quadrant of left female breast: Secondary | ICD-10-CM

## 2016-08-15 DIAGNOSIS — Z510 Encounter for antineoplastic radiation therapy: Secondary | ICD-10-CM | POA: Diagnosis not present

## 2016-08-15 DIAGNOSIS — Z170 Estrogen receptor positive status [ER+]: Principal | ICD-10-CM

## 2016-08-18 NOTE — Progress Notes (Signed)
  Radiation Oncology         [phone]) [phone] ________________________________  Name: Jenna Lowe MRN: [id number]  Date: 08/15/2016  DOB: 10-06-1945   DIAGNOSIS:     ICD-9-CM ICD-10-CM   1. Malignant neoplasm of upper-inner quadrant of left breast in female, estrogen receptor positive ([place]) 174.2 C50.212    V86.0 Z17.0     SIMULATION AND TREATMENT PLANNING NOTE  The patient presented for simulation prior to beginning her course of radiation treatment for her diagnosis of left-sided breast cancer. The patient was placed in a supine position on a breast board. A customized vac-lock bag was constructed and this complex treatment device will be used on a daily basis during her treatment. In this fashion, a CT scan was obtained through the chest area and an isocenter was placed near the chest wall within the breast.  The patient will be planned to receive a course of radiation initially to a dose of 42.5 Gy. This will consist of a whole breast radiotherapy technique. To accomplish this, 2 customized blocks have been designed which will correspond to medial and lateral whole breast tangent fields. This treatment will be accomplished at 2.5 Gy per fraction. A forward planning technique will also be evaluated to determine if this approach improves the plan. It is anticipated that the patient will then receive a 7.5 Gy boost to the seroma cavity which has been contoured. This will be accomplished at 2.5 Gy per fraction.   This initial treatment will consist of a 3-D conformal technique. The seroma has been contoured as the primary target structure. Additionally, dose volume histograms of both this target as well as the lungs and heart will also be evaluated. Such an approach is necessary to ensure that the target area is adequately covered while the nearby critical  normal structures are adequately spared.  Plan:  The final anticipated total dose therefore will correspond to 50 Gy.   Special  treatment procedure was performed today due to the extra time and effort required by myself to plan and prepare this patient for deep inspiration breath hold technique.  I have determined cardiac sparing to be of benefit to this patient to prevent long term cardiac damage due to radiation of the heart.  Bellows were placed on the patient's abdomen. To facilitate cardiac sparing, the patient was coached by the radiation therapists on breath hold techniques and breathing practice was performed. Practice waveforms were obtained. The patient was then scanned while maintaining breath hold in the treatment position.  This image was then transferred over to the imaging specialist. The imaging specialist then created a fusion of the free breathing and breath hold scans using the chest wall as the stable structure. I personally reviewed the fusion in axial, coronal and sagittal image planes.  Excellent cardiac sparing was obtained.  I felt the patient is an appropriate candidate for breath hold and the patient will be treated as such.  The image fusion was then reviewed with the patient to reinforce the necessity of reproducible breath hold.      _______________________________   [name], MD, PhD

## 2016-08-18 NOTE — Progress Notes (Signed)
  Radiation Oncology         [phone]) [phone] ________________________________  Name: Jenna Lowe MRN: [id number]  Date: 08/15/2016  DOB: Oct 25, 1945  Optical Surface Tracking Plan:  Since intensity modulated radiotherapy (IMRT) and 3D conformal radiation treatment methods are predicated on accurate and precise positioning for treatment, intrafraction motion monitoring is medically necessary to ensure accurate and safe treatment delivery.  The ability to quantify intrafraction motion without excessive ionizing radiation dose can only be performed with optical surface tracking. Accordingly, surface imaging offers the opportunity to obtain 3D measurements of patient position throughout IMRT and 3D treatments without excessive radiation exposure.  I am ordering optical surface tracking for this patient's upcoming course of radiotherapy. ________________________________  Kyung Rudd, MD 08/18/2016 11:24 AM    Reference:   Ursula Alert, J, et al. Surface imaging-based analysis of intrafraction motion for breast radiotherapy patients.Journal of [employer], n. 6, nov. 2014. ISSN [id number].   Available at: <http://www.jacmp.org/index.php/jacmp/article/view/4957>.

## 2016-08-20 DIAGNOSIS — Z510 Encounter for antineoplastic radiation therapy: Secondary | ICD-10-CM | POA: Diagnosis not present

## 2016-08-22 ENCOUNTER — Ambulatory Visit
Admission: RE | Admit: 2016-08-22 | Discharge: 2016-08-22 | Disposition: A | Discharge status: Home / Self Care | Payer: Medicare Other | Source: Ambulatory Visit | Attending: Radiation Oncology | Admitting: Radiation Oncology

## 2016-08-22 DIAGNOSIS — Z510 Encounter for antineoplastic radiation therapy: Secondary | ICD-10-CM | POA: Diagnosis not present

## 2016-08-25 ENCOUNTER — Ambulatory Visit
Admission: RE | Admit: 2016-08-25 | Discharge: 2016-08-25 | Disposition: A | Discharge status: Home / Self Care | Payer: Medicare Other | Source: Ambulatory Visit | Attending: Radiation Oncology | Admitting: Radiation Oncology

## 2016-08-25 DIAGNOSIS — Z510 Encounter for antineoplastic radiation therapy: Secondary | ICD-10-CM | POA: Diagnosis not present

## 2016-08-26 ENCOUNTER — Ambulatory Visit
Admission: RE | Admit: 2016-08-26 | Discharge: 2016-08-26 | Disposition: A | Discharge status: Home / Self Care | Payer: Medicare Other | Source: Ambulatory Visit | Attending: Radiation Oncology | Admitting: Radiation Oncology

## 2016-08-26 DIAGNOSIS — Z510 Encounter for antineoplastic radiation therapy: Secondary | ICD-10-CM | POA: Diagnosis not present

## 2016-08-27 ENCOUNTER — Ambulatory Visit
Admission: RE | Admit: 2016-08-27 | Discharge: 2016-08-27 | Disposition: A | Discharge status: Home / Self Care | Payer: Medicare Other | Source: Ambulatory Visit | Attending: Radiation Oncology | Admitting: Radiation Oncology

## 2016-08-27 DIAGNOSIS — Z170 Estrogen receptor positive status [ER+]: Secondary | ICD-10-CM | POA: Insufficient documentation

## 2016-08-27 DIAGNOSIS — C50212 Malignant neoplasm of upper-inner quadrant of left female breast: Secondary | ICD-10-CM | POA: Insufficient documentation

## 2016-08-27 DIAGNOSIS — Z510 Encounter for antineoplastic radiation therapy: Secondary | ICD-10-CM | POA: Diagnosis not present

## 2016-08-27 MED ORDER — RADIAPLEXRX EX GEL
Freq: Once | CUTANEOUS | Status: AC
  Administered 2016-08-27: 14:00:00 via TOPICAL

## 2016-08-27 MED ORDER — ALRA NON-METALLIC DEODORANT (RAD-ONC)
1.0000 "application " | Freq: Once | TOPICAL | Status: AC
  Administered 2016-08-27: 1 via TOPICAL

## 2016-08-27 NOTE — Progress Notes (Signed)
Pt education done, breast, Radiation therapy and you book, my business card, alra and radiaplex cream given , skin irritating,pain, fatigue discussed, exercise, have rest periods, get plenty sleep, luke warm baths/ showers, pat dry, no rubing scrubbing or scratching treated area, increase protein in diet, stay hydrated, drink plenty water, no under wire bras, electric shaver only, apply deodorant and radiaplex after rad txs and in am after showers daily, nothing 4 hours before rad txs, teach back gien

## 2016-08-28 ENCOUNTER — Encounter: Payer: Self-pay | Admitting: Radiation Oncology

## 2016-08-28 ENCOUNTER — Ambulatory Visit
Admission: RE | Admit: 2016-08-28 | Discharge: 2016-08-28 | Disposition: A | Discharge status: Home / Self Care | Payer: Medicare Other | Source: Ambulatory Visit | Attending: Radiation Oncology | Admitting: Radiation Oncology

## 2016-08-28 DIAGNOSIS — Z510 Encounter for antineoplastic radiation therapy: Secondary | ICD-10-CM | POA: Diagnosis not present

## 2016-08-29 ENCOUNTER — Ambulatory Visit
Admission: RE | Admit: 2016-08-29 | Discharge: 2016-08-29 | Disposition: A | Discharge status: Home / Self Care | Payer: Medicare Other | Source: Ambulatory Visit | Attending: Radiation Oncology | Admitting: Radiation Oncology

## 2016-08-29 DIAGNOSIS — Z510 Encounter for antineoplastic radiation therapy: Secondary | ICD-10-CM | POA: Diagnosis not present

## 2016-09-01 ENCOUNTER — Ambulatory Visit
Admission: RE | Admit: 2016-09-01 | Discharge: 2016-09-01 | Disposition: A | Discharge status: Home / Self Care | Payer: Medicare Other | Source: Ambulatory Visit | Attending: Radiation Oncology | Admitting: Radiation Oncology

## 2016-09-01 DIAGNOSIS — Z510 Encounter for antineoplastic radiation therapy: Secondary | ICD-10-CM | POA: Diagnosis not present

## 2016-09-02 ENCOUNTER — Ambulatory Visit
Admission: RE | Admit: 2016-09-02 | Discharge: 2016-09-02 | Disposition: A | Discharge status: Home / Self Care | Payer: Medicare Other | Source: Ambulatory Visit | Attending: Radiation Oncology | Admitting: Radiation Oncology

## 2016-09-02 DIAGNOSIS — Z510 Encounter for antineoplastic radiation therapy: Secondary | ICD-10-CM | POA: Diagnosis not present

## 2016-09-03 ENCOUNTER — Ambulatory Visit
Admission: RE | Admit: 2016-09-03 | Discharge: 2016-09-03 | Disposition: A | Discharge status: Home / Self Care | Payer: Medicare Other | Source: Ambulatory Visit | Attending: Radiation Oncology | Admitting: Radiation Oncology

## 2016-09-03 DIAGNOSIS — Z510 Encounter for antineoplastic radiation therapy: Secondary | ICD-10-CM | POA: Diagnosis not present

## 2016-09-04 ENCOUNTER — Encounter: Payer: Self-pay | Admitting: Radiation Oncology

## 2016-09-04 ENCOUNTER — Ambulatory Visit
Admission: RE | Admit: 2016-09-04 | Discharge: 2016-09-04 | Disposition: A | Discharge status: Home / Self Care | Payer: Medicare Other | Source: Ambulatory Visit | Attending: Radiation Oncology | Admitting: Radiation Oncology

## 2016-09-04 VITALS — BP 108/61 | HR 94 | Temp 98.0°F | Resp 18 | Ht 63.0 in | Wt 121.8 lb

## 2016-09-04 DIAGNOSIS — C50212 Malignant neoplasm of upper-inner quadrant of left female breast: Secondary | ICD-10-CM

## 2016-09-04 DIAGNOSIS — Z170 Estrogen receptor positive status [ER+]: Principal | ICD-10-CM

## 2016-09-04 DIAGNOSIS — Z510 Encounter for antineoplastic radiation therapy: Secondary | ICD-10-CM | POA: Diagnosis not present

## 2016-09-04 NOTE — Progress Notes (Signed)
  Radiation Oncology         [phone] ________________________________  Name: Jenna Lowe MRN: [id number]  Date: 09/04/2016  DOB: 09/06/1945  Weekly Radiation Therapy Management    ICD-9-CM ICD-10-CM   1. Malignant neoplasm of upper-inner quadrant of left breast in female, estrogen receptor positive ([place]) 174.2 C50.212    V86.0 Z17.0      Current Dose: 22.5 Gy     Planned Dose:  50 Gy  Narrative . . . . . . . . The patient presents for routine under treatment assessment. Pt endorses fatigue but takes short naps as remedy. She endorses a good appetite. Pt also endorses radiaplex gel twice daily. Pt endorses tenderness at site of radiation.                          The patient is otherwise without complaint.                                 Set-up films were reviewed.                                 The chart was checked. Physical Findings. . .  height is 5\' 3"  (1.6 m) and weight is 121 lb 12.8 oz (55.2 kg). Her oral temperature is 98 F (36.7 C). Her blood pressure is 108/61 and her pulse is 94. Her respiration is 18 and oxygen saturation is 100%. . Weight essentially stable.  No significant changes. Lungs are clear to auscultation bilaterally. Heart has regular rate and rhythm. No palpable cervical, supraclavicular, or axillary adenopathy. Abdomen soft, non-tender, normal bowel sounds. Mild erythema to the left breast.  Impression . . . . . . . The patient is tolerating radiation. Plan . . . . . . . . . . . . Continue treatment as planned.  ________________________________   Blair Promise, PhD, MD  This document serves as a record of services personally performed by Gery Pray, MD. It was created on his behalf by Linward Natal, a trained medical scribe. The creation of this record is based on the scribe's personal observations and the provider's statements to them. This document has been checked and approved by the attending provider.

## 2016-09-04 NOTE — Progress Notes (Signed)
Jenna Lowe has received 9 radiation treatments to her left breast.  Skin to left breast intact with mild pink color,swelling and tenderness.  Using Radiaplex gel bid.  Appetite id good.  Having fatigue taking short naps. Wt Readings from Last 3 Encounters:  09/04/16 121 lb 12.8 oz (55.2 kg)  08/11/16 119 lb 6.4 oz (54.2 kg)  08/11/16 119 lb (54 kg)  BP 108/61   Pulse 94   Temp 98 F (36.7 C) (Oral)   Resp 18   Ht 5\' 3"  (1.6 m)   Wt 121 lb 12.8 oz (55.2 kg)   SpO2 100%   BMI 21.58 kg/m

## 2016-09-05 ENCOUNTER — Ambulatory Visit
Admission: RE | Admit: 2016-09-05 | Discharge: 2016-09-05 | Disposition: A | Discharge status: Home / Self Care | Payer: Medicare Other | Source: Ambulatory Visit | Attending: Radiation Oncology | Admitting: Radiation Oncology

## 2016-09-05 DIAGNOSIS — Z510 Encounter for antineoplastic radiation therapy: Secondary | ICD-10-CM | POA: Diagnosis not present

## 2016-09-08 ENCOUNTER — Ambulatory Visit
Admission: RE | Admit: 2016-09-08 | Discharge: 2016-09-08 | Disposition: A | Discharge status: Home / Self Care | Payer: Medicare Other | Source: Ambulatory Visit | Attending: Radiation Oncology | Admitting: Radiation Oncology

## 2016-09-08 ENCOUNTER — Ambulatory Visit: Admission: RE | Admit: 2016-09-08 | Payer: Medicare Other | Source: Ambulatory Visit | Admitting: Radiation Oncology

## 2016-09-08 DIAGNOSIS — Z510 Encounter for antineoplastic radiation therapy: Secondary | ICD-10-CM | POA: Diagnosis not present

## 2016-09-09 ENCOUNTER — Ambulatory Visit
Admission: RE | Admit: 2016-09-09 | Discharge: 2016-09-09 | Disposition: A | Discharge status: Home / Self Care | Payer: Medicare Other | Source: Ambulatory Visit | Attending: Radiation Oncology | Admitting: Radiation Oncology

## 2016-09-09 DIAGNOSIS — Z510 Encounter for antineoplastic radiation therapy: Secondary | ICD-10-CM | POA: Diagnosis not present

## 2016-09-10 ENCOUNTER — Ambulatory Visit
Admission: RE | Admit: 2016-09-10 | Discharge: 2016-09-10 | Disposition: A | Discharge status: Home / Self Care | Payer: Medicare Other | Source: Ambulatory Visit | Attending: Radiation Oncology | Admitting: Radiation Oncology

## 2016-09-10 DIAGNOSIS — Z510 Encounter for antineoplastic radiation therapy: Secondary | ICD-10-CM | POA: Diagnosis not present

## 2016-09-11 ENCOUNTER — Ambulatory Visit
Admission: RE | Admit: 2016-09-11 | Discharge: 2016-09-11 | Disposition: A | Discharge status: Home / Self Care | Payer: Medicare Other | Source: Ambulatory Visit | Attending: Radiation Oncology | Admitting: Radiation Oncology

## 2016-09-11 DIAGNOSIS — Z510 Encounter for antineoplastic radiation therapy: Secondary | ICD-10-CM | POA: Diagnosis not present

## 2016-09-12 ENCOUNTER — Ambulatory Visit
Admission: RE | Admit: 2016-09-12 | Discharge: 2016-09-12 | Disposition: A | Discharge status: Home / Self Care | Payer: Medicare Other | Source: Ambulatory Visit | Attending: Radiation Oncology | Admitting: Radiation Oncology

## 2016-09-12 DIAGNOSIS — Z510 Encounter for antineoplastic radiation therapy: Secondary | ICD-10-CM | POA: Diagnosis not present

## 2016-09-12 NOTE — Progress Notes (Signed)
Complex simulation note  Diagnosis: left-sided breast cancer  Narrative The patient has initially been planned to receive a course of whole breast radiation to a dose of 42.5 Gy in 17 fractions. The patient will now receive an additional boost to the seroma cavity which has been contoured. This will correspond to a boost of 7.5 Gy at 2.5 Gy per fraction. To accomplish this, an additional 3 customized blocks have been designed for this purpose. A complex isodose plan is requested to ensure that the target area is adequately covered with radiation dose and that the nearby normal structures such as the lung are adequately spared. The patient's final total dose will be 50 Gy.  ------------------------------------------------  [name], MD, PhD

## 2016-09-15 ENCOUNTER — Ambulatory Visit
Admission: RE | Admit: 2016-09-15 | Discharge: 2016-09-15 | Disposition: A | Discharge status: Home / Self Care | Payer: Medicare Other | Source: Ambulatory Visit | Attending: Radiation Oncology | Admitting: Radiation Oncology

## 2016-09-15 DIAGNOSIS — Z510 Encounter for antineoplastic radiation therapy: Secondary | ICD-10-CM | POA: Diagnosis not present

## 2016-09-16 ENCOUNTER — Ambulatory Visit
Admission: RE | Admit: 2016-09-16 | Discharge: 2016-09-16 | Disposition: A | Discharge status: Home / Self Care | Payer: Medicare Other | Source: Ambulatory Visit | Attending: Radiation Oncology | Admitting: Radiation Oncology

## 2016-09-16 ENCOUNTER — Encounter: Payer: Self-pay | Admitting: Hematology and Oncology

## 2016-09-16 ENCOUNTER — Ambulatory Visit (HOSPITAL_BASED_OUTPATIENT_CLINIC_OR_DEPARTMENT_OTHER): Payer: Medicare Other | Admitting: Hematology and Oncology

## 2016-09-16 DIAGNOSIS — L309 Dermatitis, unspecified: Secondary | ICD-10-CM

## 2016-09-16 DIAGNOSIS — Z510 Encounter for antineoplastic radiation therapy: Secondary | ICD-10-CM | POA: Diagnosis not present

## 2016-09-16 DIAGNOSIS — Z170 Estrogen receptor positive status [ER+]: Secondary | ICD-10-CM | POA: Diagnosis not present

## 2016-09-16 DIAGNOSIS — C50212 Malignant neoplasm of upper-inner quadrant of left female breast: Secondary | ICD-10-CM

## 2016-09-16 MED ORDER — LETROZOLE 2.5 MG PO TABS: 2.5000 mg | ORAL_TABLET | Freq: Every day | ORAL | 3 refills | Status: DC

## 2016-09-16 MED ORDER — RADIAPLEXRX EX GEL
Freq: Once | CUTANEOUS | Status: AC
  Administered 2016-09-16: 13:00:00 via TOPICAL

## 2016-09-16 NOTE — Progress Notes (Signed)
Patient Care Team: Prince Solian, MD as PCP - General (Internal Medicine)  DIAGNOSIS:  Encounter Diagnosis  Name Primary?  . Malignant neoplasm of upper-inner quadrant of left breast in female, estrogen receptor positive ([place])     SUMMARY OF ONCOLOGIC HISTORY:   Malignant neoplasm of upper-inner quadrant of left breast in female, estrogen receptor positive ([place])   07/03/2016 Initial Diagnosis    Left breast biopsy mid breast: IDC, grade 1 with DCIS and calcifications, ER 100%, PR 30%, Ki-67 10%, HER-2 negative ratio 1.19      07/22/2016 Surgery    Left lumpectomy: IDC with calcifications, grade 1, 0.4 cm, with low-grade DCIS, margins negative, 0/1 lymph node negative, ER 100%, PR 30%, HER-2 negative ratio 1.19, Ki-67 10%, T1aN0 stage IA      08/25/2016 - 09/16/2016 Radiation Therapy    Adjuvant radiation therapy       CHIEF COMPLIANT: Follow-up on adjuvant radiation  INTERVAL HISTORY: Jenna Lowe is a 71 year old with above-mentioned history left breast cancer treated with lumpectomy and adjuvant radiation is ongoing and she has few more days left of the radiation treatment. She is here today to discuss the following steps. She is here to talk about adjuvant antiestrogen treatments. She is doing quite well from the radiation standpoint. She does have some dermatitis.  REVIEW OF SYSTEMS:   Constitutional: Denies fevers, chills or abnormal weight loss Eyes: Denies blurriness of vision Ears, nose, mouth, throat, and face: Denies mucositis or sore throat Respiratory: Denies cough, dyspnea or wheezes Cardiovascular: Denies palpitation, chest discomfort Gastrointestinal:  Denies nausea, heartburn or change in bowel habits Skin: Denies abnormal skin rashes Lymphatics: Denies new lymphadenopathy or easy bruising Neurological:Denies numbness, tingling or new weaknesses Behavioral/Psych: Mood is stable, no new changes  Extremities: No lower extremity edema Breast: Radiation  dermatitis left breast All other systems were reviewed with the patient and are negative.  I have reviewed the past medical history, past surgical history, social history and family history with the patient and they are unchanged from previous note.  ALLERGIES:  is allergic to pseudoephedrine; sulfa antibiotics; and augmentin [amoxicillin-pot clavulanate].  MEDICATIONS:  Current Outpatient Prescriptions  Medication Sig Dispense Refill  . acetaminophen (TYLENOL) 500 MG chewable tablet Chew 500 mg by mouth every 6 (six) hours as needed for pain.    Marland Kitchen aspirin 81 MG tablet Take 81 mg by mouth daily.    . cetirizine (ZYRTEC) 10 MG chewable tablet Chew 10 mg by mouth daily.    . fluticasone (FLONASE) 50 MCG/ACT nasal spray Place into both nostrils daily.    . hydrochlorothiazide (MICROZIDE) 12.5 MG capsule Take 1 capsule (12.5 mg total) by mouth daily.    Marland Kitchen ibuprofen (ADVIL,MOTRIN) 200 MG tablet Take 200 mg by mouth every 8 (eight) hours as needed for mild pain.    Marland Kitchen letrozole (FEMARA) 2.5 MG tablet Take 1 tablet (2.5 mg total) by mouth daily. 90 tablet 3  . lisinopril (PRINIVIL,ZESTRIL) 20 MG tablet Take 20 mg by mouth daily.    . Multiple Vitamins-Minerals (MULTIVITAMIN WITH MINERALS) tablet Take 1 tablet by mouth daily.    . Omega-3 Fat Ac-Cholecalciferol (MINICAPS VITAMIN-D/OMEGA-3 PO) Take 1 capsule by mouth daily.     No current facility-administered medications for this visit.     PHYSICAL EXAMINATION: ECOG PERFORMANCE STATUS: 1 - Symptomatic but completely ambulatory  Vitals:   09/16/16 1115  BP: 106/78  Pulse: 73  Resp: 18  Temp: 97.4 F (36.3 C)   Filed Weights  09/16/16 1115  Weight: 119 lb (54 kg)    GENERAL:alert, no distress and comfortable SKIN: skin color, texture, turgor are normal, no rashes or significant lesions EYES: normal, Conjunctiva are pink and non-injected, sclera clear OROPHARYNX:no exudate, no erythema and lips, buccal mucosa, and tongue normal    NECK: supple, thyroid normal size, non-tender, without nodularity LYMPH:  no palpable lymphadenopathy in the cervical, axillary or inguinal LUNGS: clear to auscultation and percussion with normal breathing effort HEART: regular rate & rhythm and no murmurs and no lower extremity edema ABDOMEN:abdomen soft, non-tender and normal bowel sounds MUSCULOSKELETAL:no cyanosis of digits and no clubbing  NEURO: alert & oriented x 3 with fluent speech, no focal motor/sensory deficits EXTREMITIES: No lower extremity edema  LABORATORY DATA:  I have reviewed the data as listed   Chemistry      Component Value Date/Time   NA 143 07/22/2016 0832   K 3.7 07/22/2016 0832   CL 105 07/22/2016 0832   BUN 14 07/22/2016 0832   CREATININE 0.60 07/22/2016 0832   No results found for: CALCIUM, ALKPHOS, AST, ALT, BILITOT     Lab Results  Component Value Date   HGB 13.3 07/22/2016   HCT 39.0 07/22/2016    ASSESSMENT & PLAN:  Malignant neoplasm of upper-inner quadrant of left breast in female, estrogen receptor positive ([place]) Right lumpectomy 07/22/2016: IDC with calcifications, grade 1, 0.4 cm, with low-grade DCIS, margins negative, 0/1 lymph node negative, ER 100%, PR 30%, HER-2 negative ratio 1.19, Ki-67 10%, T1aN0 stage IA  Pathology counseling: I discussed the final pathology report of the patient provided  a copy of this report. I discussed the margins as well as lymph node surgeries. We also discussed the final staging along with previously performed ER/PR and HER-2/neu testing.  Recommendation: 1. Adjuvant radiation therapy 08/25/2016- 09/16/2016 2. followed by adjuvant antiestrogen therapy with letrozole 2.5 mg daily started 10/01/2006  Letrozole counseling: I previously counseled her extensively about risks and benefits of letrozole therapy and she understands this. I sent a prescription for it today.  Return to clinic in 3 months for toxicity check and follow-up   I spent 25 minutes  talking to the patient of which more than half was spent in counseling and coordination of care.  No orders of the defined types were placed in this encounter.  The patient has a good understanding of the overall plan. she agrees with it. she will call with any problems that may develop before the next visit here.   Rulon Eisenmenger, MD 09/16/16

## 2016-09-16 NOTE — Assessment & Plan Note (Signed)
Right lumpectomy 07/22/2016: IDC with calcifications, grade 1, 0.4 cm, with low-grade DCIS, margins negative, 0/1 lymph node negative, ER 100%, PR 30%, HER-2 negative ratio 1.19, Ki-67 10%, T1aN0 stage IA  Pathology counseling: I discussed the final pathology report of the patient provided  a copy of this report. I discussed the margins as well as lymph node surgeries. We also discussed the final staging along with previously performed ER/PR and HER-2/neu testing.  Recommendation: 1. Adjuvant radiation therapy 08/25/2016- 09/16/2016 2. followed by adjuvant antiestrogen therapy with letrozole 2.5 mg daily started 10/01/2006  Letrozole counseling: I previously counseled her extensively about risks and benefits of letrozole therapy and she understands this. I sent a prescription for it today.  Return to clinic in 3 months for toxicity check and follow-up

## 2016-09-17 ENCOUNTER — Ambulatory Visit
Admission: RE | Admit: 2016-09-17 | Discharge: 2016-09-17 | Disposition: A | Discharge status: Home / Self Care | Payer: Medicare Other | Source: Ambulatory Visit | Attending: Radiation Oncology | Admitting: Radiation Oncology

## 2016-09-17 DIAGNOSIS — Z510 Encounter for antineoplastic radiation therapy: Secondary | ICD-10-CM | POA: Diagnosis not present

## 2016-09-18 ENCOUNTER — Ambulatory Visit
Admission: RE | Admit: 2016-09-18 | Discharge: 2016-09-18 | Disposition: A | Discharge status: Home / Self Care | Payer: Medicare Other | Source: Ambulatory Visit | Attending: Radiation Oncology | Admitting: Radiation Oncology

## 2016-09-18 DIAGNOSIS — Z510 Encounter for antineoplastic radiation therapy: Secondary | ICD-10-CM | POA: Diagnosis not present

## 2016-09-19 ENCOUNTER — Telehealth: Payer: Self-pay | Admitting: *Deleted

## 2016-09-19 ENCOUNTER — Ambulatory Visit
Admission: RE | Admit: 2016-09-19 | Discharge: 2016-09-19 | Disposition: A | Discharge status: Home / Self Care | Payer: Medicare Other | Source: Ambulatory Visit | Attending: Radiation Oncology | Admitting: Radiation Oncology

## 2016-09-19 DIAGNOSIS — Z510 Encounter for antineoplastic radiation therapy: Secondary | ICD-10-CM | POA: Diagnosis not present

## 2016-09-19 NOTE — Telephone Encounter (Signed)
  Oncology Nurse Navigator Documentation  Navigator Location: [facility] (09/19/16 0900)   )Navigator Encounter Type: Telephone (09/19/16 0900) Telephone: [facility] Call (09/19/16 0900)                   Patient Visit Type: RadOnc (09/19/16 0900) Treatment Phase: Final Radiation Tx (09/19/16 0900)     Interventions: Referrals (09/19/16 0900) Referrals: Survivorship (09/19/16 0900)                    Time Spent with Patient: 15 (09/19/16 0900)

## 2016-09-29 ENCOUNTER — Encounter: Payer: Self-pay | Admitting: Radiation Oncology

## 2016-09-29 NOTE — Progress Notes (Signed)
  Radiation Oncology         [phone]) [phone] ________________________________  Name: Jenna Lowe MRN: [id number]  Date: 09/29/2016  DOB: 05-28-1946  End of Treatment Note  Diagnosis:  The encounter diagnosis was Malignant neoplasm of upper-inner quadrant of left breast in female, estrogen receptor positive ([place]). 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 During treatment, she complained of fatigue and tenderness at treatment site.  Plan: The patient has completed radiation treatment. The patient will return to radiation oncology clinic for routine followup in one month. I advised them to call or return sooner if they have any questions or concerns related to their recovery or treatment.  ------------------------------------------------  [name], MD, PhD  This document serves as a record of services personally performed by Kyung Rudd, MD. It was created on his behalf by Bethann Humble, a trained medical scribe. The creation of this record is based on the scribe's personal observations and the provider's statements to them. This document has been checked and approved by the attending provider.

## 2016-10-03 ENCOUNTER — Telehealth: Payer: Self-pay | Admitting: *Deleted

## 2016-10-03 NOTE — Telephone Encounter (Signed)
Called patient to alter fu appt. to 8:30 am on May 22, spoke with patient and she agreed to new time on May 22

## 2016-10-13 NOTE — Progress Notes (Signed)
Jenna Lowe 72 y.o female.with Malignant neoplasm of upper-inner quadrant of left breast in female, estrogen receptor positive radiation completed 09-19-16,one month FU.  Skin status:Left breast with mild tanning and with tenderness. What lotion are you using? Still using Radiaplex gel to left breast.  Will purchase a lotion with vitamin E. Have you seen med onc? If not, when is appointment:09-16-16 Dr. Lindi Adie  Letrozole counseling,  Return to clinic in 3 months for toxicity check and follow-up.  If they are ER+, have they started Al or Tamoxifen? If not, why?  09-30-16 Letrozole Discuss survivorship appointment:01-22-17 Wilber Bihari, N.P. Have you had a mammogram scheduled? Not scheduled yet Offer referral to [facility]. Will receive at the survivorship appointment:01-22-17 Appetite:Fair appetite eating two meals daily. Pain:No Fatigue:Engery level is getting better,low energy lwvel in the afternoons. Arm mobility:Able to raise left arm without difficulty. Lymphedema:None Wt Readings from Last 3 Encounters:  10/21/16 116 lb 9.6 oz (52.9 kg)  09/16/16 119 lb (54 kg)  09/04/16 121 lb 12.8 oz (55.2 kg)  BP 123/76   Pulse 96   Temp 98.3 F (36.8 C) (Oral)   Resp 16   Ht 5\' 3"  (1.6 m)   Wt 116 lb 9.6 oz (52.9 kg)   SpO2 97%   BMI 20.65 kg/m

## 2016-10-20 NOTE — Progress Notes (Signed)
Radiation Oncology         [phone]) [phone] ________________________________  Name: Jenna Lowe MRN: [id number]  Date: 10/21/2016  DOB: 03-19-1946  Post Treatment Note  CC: Prince Solian, MD  Excell Seltzer, MD  Diagnosis:   Stage IA, cT1bN0, grade 1, ER/PR positive invasive ductal carcinoma of the left breast.  Interval Since Last Radiation:  4 weeks   08/25/16-09/19/16: 1. Left breast/ 42.5 Gy in 17 fractions 2. Left breast boost/ 7.5 Gy in 3 fractions  Narrative:  The patient returns today for routine follow-up. During treatment she did very well with radiotherapy and did not have significant desquamation.                             On review of systems, the patient states she's doing well. She's concerned about a lesion she's felt deep in the abdominal wall. This has been there for several months she believes and is nontender, and not associated with any skin lesions. She denies concerns with the skin of her breasts.  ALLERGIES:  is allergic to pseudoephedrine; sulfa antibiotics; and augmentin [amoxicillin-pot clavulanate].  Meds: Current Outpatient Prescriptions  Medication Sig Dispense Refill  . aspirin 81 MG tablet Take 81 mg by mouth daily.    . cetirizine (ZYRTEC) 10 MG chewable tablet Chew 10 mg by mouth daily.    . fluticasone (FLONASE) 50 MCG/ACT nasal spray Place into both nostrils daily.    . hydrochlorothiazide (MICROZIDE) 12.5 MG capsule Take 1 capsule (12.5 mg total) by mouth daily.    Marland Kitchen ibuprofen (ADVIL,MOTRIN) 200 MG tablet Take 200 mg by mouth every 8 (eight) hours as needed for mild pain.    Marland Kitchen letrozole (FEMARA) 2.5 MG tablet Take 1 tablet (2.5 mg total) by mouth daily. 90 tablet 3  . lisinopril (PRINIVIL,ZESTRIL) 20 MG tablet Take 20 mg by mouth daily.    . Multiple Vitamins-Minerals (MULTIVITAMIN WITH MINERALS) tablet Take 1 tablet by mouth daily.    . Omega-3 Fat Ac-Cholecalciferol (MINICAPS VITAMIN-D/OMEGA-3 PO) Take 1 capsule by mouth daily.      Marland Kitchen acetaminophen (TYLENOL) 500 MG chewable tablet Chew 500 mg by mouth every 6 (six) hours as needed for pain.     No current facility-administered medications for this encounter.     Physical Findings:  height is 5\' 3"  (1.6 m) and weight is 116 lb 9.6 oz (52.9 kg). Her oral temperature is 98.3 F (36.8 C). Her blood pressure is 123/76 and her pulse is 96. Her respiration is 16 and oxygen saturation is 97%.  Pain Assessment Pain Score: 0-No pain/10 In general this is a well appearing caucasian female in no acute distress. She's alert and oriented x4 and appropriate throughout the examination. Cardiopulmonary assessment is negative for acute distress and she exhibits normal effort. The left breast was examined and reveals mild hyperpigmentation without desquamation. She has a small sub cm area of thickening of the right anterior abdominal wall with deep palpation without lesions or producible pain. This is suspicious for lipoma.  Lab Findings: Lab Results  Component Value Date   HGB 13.3 07/22/2016   HCT 39.0 07/22/2016     Radiographic Findings: No results found.  Impression/Plan: 1. Stage IA, cT1bN0, grade 1, ER/PR positive invasive ductal carcinoma of the left breast. The patient has been doing well since completion of radiotherapy. We discussed that we would be happy to continue to follow her as needed, but she will also continue  to follow up with Dr. Lindi Adie in medical oncology. She was counseled on skin care as well as measures to avoid sun exposure to this area.  2. Survivorship. She is counseled on attending survivorship appointment in August. 3. Abdominal wall lesion. Although this could be a lipoma, I've encouraged her to discuss this further with her PCP and oncologist.     Carola Rhine, PAC

## 2016-10-21 ENCOUNTER — Encounter: Payer: Self-pay | Admitting: Radiation Oncology

## 2016-10-21 ENCOUNTER — Ambulatory Visit
Admission: RE | Admit: 2016-10-21 | Discharge: 2016-10-21 | Disposition: A | Discharge status: Home / Self Care | Payer: Medicare Other | Source: Ambulatory Visit | Attending: Radiation Oncology | Admitting: Radiation Oncology

## 2016-10-21 VITALS — BP 123/76 | HR 96 | Temp 98.3°F | Resp 16 | Ht 63.0 in | Wt 116.6 lb

## 2016-10-21 DIAGNOSIS — C50212 Malignant neoplasm of upper-inner quadrant of left female breast: Secondary | ICD-10-CM | POA: Insufficient documentation

## 2016-10-21 DIAGNOSIS — Z510 Encounter for antineoplastic radiation therapy: Secondary | ICD-10-CM | POA: Diagnosis not present

## 2016-10-21 DIAGNOSIS — Z170 Estrogen receptor positive status [ER+]: Secondary | ICD-10-CM | POA: Diagnosis not present

## 2016-10-21 NOTE — Addendum Note (Signed)
Encounter addended by: Malena Edman, RN on: 10/21/2016 10:04 AM<BR>    Actions taken: Charge Capture section accepted

## 2016-10-31 NOTE — Addendum Note (Signed)
Addendum  created 10/31/16 1028 by Effie Berkshire, MD   Sign clinical note

## 2016-12-16 ENCOUNTER — Encounter: Payer: Self-pay | Admitting: Hematology and Oncology

## 2016-12-16 ENCOUNTER — Ambulatory Visit (HOSPITAL_BASED_OUTPATIENT_CLINIC_OR_DEPARTMENT_OTHER): Payer: Medicare Other | Admitting: Hematology and Oncology

## 2016-12-16 DIAGNOSIS — N951 Menopausal and female climacteric states: Secondary | ICD-10-CM | POA: Diagnosis not present

## 2016-12-16 DIAGNOSIS — C50212 Malignant neoplasm of upper-inner quadrant of left female breast: Secondary | ICD-10-CM | POA: Diagnosis not present

## 2016-12-16 DIAGNOSIS — Z170 Estrogen receptor positive status [ER+]: Secondary | ICD-10-CM | POA: Diagnosis not present

## 2016-12-16 DIAGNOSIS — M25552 Pain in left hip: Secondary | ICD-10-CM | POA: Diagnosis not present

## 2016-12-16 NOTE — Progress Notes (Signed)
Patient Care Team: Prince Solian, MD as PCP - General (Internal Medicine)  DIAGNOSIS:  Encounter Diagnosis  Name Primary?  . Malignant neoplasm of upper-inner quadrant of left breast in female, estrogen receptor positive ([place])     SUMMARY OF ONCOLOGIC HISTORY:   Malignant neoplasm of upper-inner quadrant of left breast in female, estrogen receptor positive ([place])   07/03/2016 Initial Diagnosis    Left breast biopsy mid breast: IDC, grade 1 with DCIS and calcifications, ER 100%, PR 30%, Ki-67 10%, HER-2 negative ratio 1.19      07/22/2016 Surgery    Left lumpectomy: IDC with calcifications, grade 1, 0.4 cm, with low-grade DCIS, margins negative, 0/1 lymph node negative, ER 100%, PR 30%, HER-2 negative ratio 1.19, Ki-67 10%, T1aN0 stage IA      08/25/2016 - 09/16/2016 Radiation Therapy    Adjuvant radiation therapy      09/30/2016 -  Anti-estrogen oral therapy    Letrozole 2.5 mg daily       CHIEF COMPLIANT: follow-up on letrozole therapy  INTERVAL HISTORY: Jenna Lowe is a 71 year old with above-mentioned history of left breast cancer currently on letrozole therapy. She has been experiencing hot flashes that wake her up at night. She change the time of the day that she takes a medication 2 morning and since then her hot flashes have improved. Also has occasional hip pain.  REVIEW OF SYSTEMS:   Constitutional: Denies fevers, chills or abnormal weight loss Eyes: Denies blurriness of vision Ears, nose, mouth, throat, and face: Denies mucositis or sore throat Respiratory: Denies cough, dyspnea or wheezes Cardiovascular: Denies palpitation, chest discomfort Gastrointestinal:  Denies nausea, heartburn or change in bowel habits Skin: Denies abnormal skin rashes Lymphatics: Denies new lymphadenopathy or easy bruising Neurological:Denies numbness, tingling or new weaknesses Behavioral/Psych: Mood is stable, no new changes  Extremities: No lower extremity edema Breast:  denies  any pain or lumps or nodules in either breasts All other systems were reviewed with the patient and are negative.  I have reviewed the past medical history, past surgical history, social history and family history with the patient and they are unchanged from previous note.  ALLERGIES:  is allergic to pseudoephedrine; sulfa antibiotics; and augmentin [amoxicillin-pot clavulanate].  MEDICATIONS:  Current Outpatient Prescriptions  Medication Sig Dispense Refill  . acetaminophen (TYLENOL) 500 MG chewable tablet Chew 500 mg by mouth every 6 (six) hours as needed for pain.    Marland Kitchen aspirin 81 MG tablet Take 81 mg by mouth daily.    . cetirizine (ZYRTEC) 10 MG chewable tablet Chew 10 mg by mouth daily.    . fluticasone (FLONASE) 50 MCG/ACT nasal spray Place into both nostrils daily.    . hydrochlorothiazide (MICROZIDE) 12.5 MG capsule Take 1 capsule (12.5 mg total) by mouth daily.    Marland Kitchen ibuprofen (ADVIL,MOTRIN) 200 MG tablet Take 200 mg by mouth every 8 (eight) hours as needed for mild pain.    Marland Kitchen letrozole (FEMARA) 2.5 MG tablet Take 1 tablet (2.5 mg total) by mouth daily. 90 tablet 3  . lisinopril (PRINIVIL,ZESTRIL) 20 MG tablet Take 20 mg by mouth daily.    . Multiple Vitamins-Minerals (MULTIVITAMIN WITH MINERALS) tablet Take 1 tablet by mouth daily.    . Omega-3 Fat Ac-Cholecalciferol (MINICAPS VITAMIN-D/OMEGA-3 PO) Take 1 capsule by mouth daily.     No current facility-administered medications for this visit.     PHYSICAL EXAMINATION: ECOG PERFORMANCE STATUS: 1 - Symptomatic but completely ambulatory  Vitals:   12/16/16 1107  BP:  138/61  Pulse: 93  Resp: 18  Temp: 97.8 F (36.6 C)   Filed Weights   12/16/16 1107  Weight: 117 lb 12.8 oz (53.4 kg)    GENERAL:alert, no distress and comfortable SKIN: skin color, texture, turgor are normal, no rashes or significant lesions EYES: normal, Conjunctiva are pink and non-injected, sclera clear OROPHARYNX:no exudate, no erythema and lips,  buccal mucosa, and tongue normal  NECK: supple, thyroid normal size, non-tender, without nodularity LYMPH:  no palpable lymphadenopathy in the cervical, axillary or inguinal LUNGS: clear to auscultation and percussion with normal breathing effort HEART: regular rate & rhythm and no murmurs and no lower extremity edema ABDOMEN:abdomen soft, non-tender and normal bowel sounds MUSCULOSKELETAL:no cyanosis of digits and no clubbing  NEURO: alert & oriented x 3 with fluent speech, no focal motor/sensory deficits EXTREMITIES: No lower extremity edema   LABORATORY DATA:  I have reviewed the data as listed   Chemistry      Component Value Date/Time   NA 143 07/22/2016 0832   K 3.7 07/22/2016 0832   CL 105 07/22/2016 0832   BUN 14 07/22/2016 0832   CREATININE 0.60 07/22/2016 0832   No results found for: CALCIUM, ALKPHOS, AST, ALT, BILITOT     Lab Results  Component Value Date   HGB 13.3 07/22/2016   HCT 39.0 07/22/2016    ASSESSMENT & PLAN:  Malignant neoplasm of upper-inner quadrant of left breast in female, estrogen receptor positive ([place]) Right lumpectomy 07/22/2016: IDC with calcifications, grade 1, 0.4 cm, with low-grade DCIS, margins negative, 0/1 lymph node negative, ER 100%, PR 30%, HER-2 negative ratio 1.19, Ki-67 10%, T1aN0 stage IA  Adjuvant radiation therapy 08/25/2016- 09/16/2016 Current treatment: Letrozole 2.5 mg daily started 09/30/2016  Letrozole toxicities: 1. Hot flashes that wake her up at night : She switched to mornings and her hot flashes have improved  2. left hip stiffness and achiness: Patient sits down in place computer games a lot. I encouraged her to stay more active. She takes occasional ibuprofen.   Return to clinic in 6 months for follow-up  I spent 25 minutes talking to the patient of which more than half was spent in counseling and coordination of care.  No orders of the defined types were placed in this encounter.  The patient has a good  understanding of the overall plan. she agrees with it. she will call with any problems that may develop before the next visit here.   Rulon Eisenmenger, MD 12/16/16

## 2016-12-16 NOTE — Assessment & Plan Note (Signed)
Right lumpectomy 07/22/2016: IDC with calcifications, grade 1, 0.4 cm, with low-grade DCIS, margins negative, 0/1 lymph node negative, ER 100%, PR 30%, HER-2 negative ratio 1.19, Ki-67 10%, T1aN0 stage IA  Adjuvant radiation therapy 08/25/2016- 09/16/2016 Current treatment: Letrozole 2.5 mg daily started 09/30/2016  Letrozole toxicities:  Return to clinic in 6 months for follow-up

## 2017-01-21 ENCOUNTER — Encounter: Payer: Self-pay | Admitting: Hematology and Oncology

## 2017-01-21 ENCOUNTER — Ambulatory Visit (HOSPITAL_BASED_OUTPATIENT_CLINIC_OR_DEPARTMENT_OTHER): Payer: Medicare Other | Admitting: Hematology and Oncology

## 2017-01-21 DIAGNOSIS — C50212 Malignant neoplasm of upper-inner quadrant of left female breast: Secondary | ICD-10-CM

## 2017-01-21 DIAGNOSIS — M25652 Stiffness of left hip, not elsewhere classified: Secondary | ICD-10-CM | POA: Diagnosis not present

## 2017-01-21 DIAGNOSIS — Z170 Estrogen receptor positive status [ER+]: Secondary | ICD-10-CM | POA: Diagnosis not present

## 2017-01-21 DIAGNOSIS — N951 Menopausal and female climacteric states: Secondary | ICD-10-CM | POA: Diagnosis not present

## 2017-01-21 MED ORDER — CETIRIZINE HCL 10 MG PO TABS: 10.0000 mg | ORAL_TABLET | Freq: Every day | ORAL | Status: AC

## 2017-01-21 MED ORDER — ANASTROZOLE 1 MG PO TABS: 1.0000 mg | ORAL_TABLET | Freq: Every day | ORAL | 0 refills | Status: DC

## 2017-01-21 NOTE — Progress Notes (Signed)
Patient Care Team: Prince Solian, MD as PCP - General (Internal Medicine)  DIAGNOSIS:  Encounter Diagnosis  Name Primary?  . Malignant neoplasm of upper-inner quadrant of left breast in female, estrogen receptor positive ([place])     SUMMARY OF ONCOLOGIC HISTORY:   Malignant neoplasm of upper-inner quadrant of left breast in female, estrogen receptor positive ([employer])   07/03/2016 Initial Diagnosis    Left breast biopsy mid breast: IDC, grade 1 with DCIS and calcifications, ER 100%, PR 30%, Ki-67 10%, HER-2 negative ratio 1.19      07/22/2016 Surgery    Left lumpectomy: IDC with calcifications, grade 1, 0.4 cm, with low-grade DCIS, margins negative, 0/1 lymph node negative, ER 100%, PR 30%, HER-2 negative ratio 1.19, Ki-67 10%, T1aN0 stage IA      08/25/2016 - 09/16/2016 Radiation Therapy    Adjuvant radiation therapy      09/30/2016 -  Anti-estrogen oral therapy    Letrozole 2.5 mg daily       CHIEF COMPLIANT: Follow-up on letrozole therapy  INTERVAL HISTORY: Jenna Lowe is a 71 year old with above-mentioned history of left breast cancer who underwent lumpectomy followed by radiation is currently on letrozole therapy. She is tolerating letrozole fairly well. She does have hot flashes and occasional muscle stiffness. Denies any lumps or nodules in the breasts.  REVIEW OF SYSTEMS:   Constitutional: Denies fevers, chills or abnormal weight loss Eyes: Denies blurriness of vision Ears, nose, mouth, throat, and face: Denies mucositis or sore throat Respiratory: Denies cough, dyspnea or wheezes Cardiovascular: Denies palpitation, chest discomfort Gastrointestinal:  Denies nausea, heartburn or change in bowel habits Skin: Denies abnormal skin rashes Lymphatics: Denies new lymphadenopathy or easy bruising Neurological:Denies numbness, tingling or new weaknesses Behavioral/Psych: Mood is stable, no new changes  Extremities: No lower extremity edema Breast:  denies any pain or  lumps or nodules in either breasts All other systems were reviewed with the patient and are negative.  I have reviewed the past medical history, past surgical history, social history and family history with the patient and they are unchanged from previous note.  ALLERGIES:  is allergic to pseudoephedrine; sulfa antibiotics; and augmentin [amoxicillin-pot clavulanate].  MEDICATIONS:  Current Outpatient Prescriptions  Medication Sig Dispense Refill  . acetaminophen (TYLENOL) 500 MG chewable tablet Chew 500 mg by mouth every 6 (six) hours as needed for pain.    Marland Kitchen aspirin 81 MG tablet Take 81 mg by mouth daily.    . cetirizine (ZYRTEC) 10 MG chewable tablet Chew 10 mg by mouth daily.    . fluticasone (FLONASE) 50 MCG/ACT nasal spray Place into both nostrils daily.    . hydrochlorothiazide (MICROZIDE) 12.5 MG capsule Take 1 capsule (12.5 mg total) by mouth daily.    Marland Kitchen ibuprofen (ADVIL,MOTRIN) 200 MG tablet Take 200 mg by mouth every 8 (eight) hours as needed for mild pain.    Marland Kitchen letrozole (FEMARA) 2.5 MG tablet Take 1 tablet (2.5 mg total) by mouth daily. 90 tablet 3  . lisinopril (PRINIVIL,ZESTRIL) 20 MG tablet Take 20 mg by mouth daily.    . Multiple Vitamins-Minerals (MULTIVITAMIN WITH MINERALS) tablet Take 1 tablet by mouth daily.    . Omega-3 Fat Ac-Cholecalciferol (MINICAPS VITAMIN-D/OMEGA-3 PO) Take 1 capsule by mouth daily.     No current facility-administered medications for this visit.     PHYSICAL EXAMINATION: ECOG PERFORMANCE STATUS: 0 - Asymptomatic  There were no vitals filed for this visit. There were no vitals filed for this visit.  GENERAL:alert, no  distress and comfortable SKIN: skin color, texture, turgor are normal, no rashes or significant lesions EYES: normal, Conjunctiva are pink and non-injected, sclera clear OROPHARYNX:no exudate, no erythema and lips, buccal mucosa, and tongue normal  NECK: supple, thyroid normal size, non-tender, without nodularity LYMPH:  no  palpable lymphadenopathy in the cervical, axillary or inguinal LUNGS: clear to auscultation and percussion with normal breathing effort HEART: regular rate & rhythm and no murmurs and no lower extremity edema ABDOMEN:abdomen soft, non-tender and normal bowel sounds MUSCULOSKELETAL:no cyanosis of digits and no clubbing  NEURO: alert & oriented x 3 with fluent speech, no focal motor/sensory deficits EXTREMITIES: No lower extremity edema BREAST: No palpable masses or nodules in either right or left breasts. No palpable axillary supraclavicular or infraclavicular adenopathy no breast tenderness or nipple discharge. (exam performed in the presence of a chaperone)  LABORATORY DATA:  I have reviewed the data as listed   Chemistry      Component Value Date/Time   NA 143 07/22/2016 0832   K 3.7 07/22/2016 0832   CL 105 07/22/2016 0832   BUN 14 07/22/2016 0832   CREATININE 0.60 07/22/2016 0832   No results found for: CALCIUM, ALKPHOS, AST, ALT, BILITOT     Lab Results  Component Value Date   HGB 13.3 07/22/2016   HCT 39.0 07/22/2016    ASSESSMENT & PLAN:  Malignant neoplasm of upper-inner quadrant of left breast in female, estrogen receptor positive ([place]) Right lumpectomy 07/22/2016: IDC with calcifications, grade 1, 0.4 cm, with low-grade DCIS, margins negative, 0/1 lymph node negative, ER 100%, PR 30%, HER-2 negative ratio 1.19, Ki-67 10%, T1aN0 stage IA  Adjuvant radiation therapy 08/25/2016- 09/16/2016 Current treatment: Letrozole 2.5 mg daily started 05/01/2018Is done to 01/19/2017   Letrozole toxicities: 1. Hot flashes that wake her up at night : She switched to mornings and her hot flashes have improved  2. left hip stiffness and achiness: Patient sits down in place computer games a lot. I encouraged her to stay more active. She takes occasional ibuprofen.  These symptoms continue to get really worse and the patient could not tolerate it.  We decided to discontinue letrozole   and start her on anastrozole 1 mg daily.  I sent a prescription for 30 days of anastrozole. She likes and tolerates it better then she will call us back to request a 90 day supply. If she cannot tolerate anastrozole then we will try tamoxifen.  Return to clinic in  January 2019 for follow-up  I spent 25 minutes talking to the patient of which more than half was spent in counseling and coordination of care.  No orders of the defined types were placed in this encounter.  The patient has a good understanding of the overall plan. she agrees with it. she will call with any problems that may develop before the next visit here.   Rulon Eisenmenger, MD 01/21/17

## 2017-01-21 NOTE — Assessment & Plan Note (Signed)
Right lumpectomy 07/22/2016: IDC with calcifications, grade 1, 0.4 cm, with low-grade DCIS, margins negative, 0/1 lymph node negative, ER 100%, PR 30%, HER-2 negative ratio 1.19, Ki-67 10%, T1aN0 stage IA  Adjuvant radiation therapy 08/25/2016- 09/16/2016 Current treatment: Letrozole 2.5 mg daily started 09/30/2016  Letrozole toxicities: 1. Hot flashes that wake her up at night : She switched to mornings and her hot flashes have improved  2. left hip stiffness and achiness: Patient sits down in place computer games a lot. I encouraged her to stay more active. She takes occasional ibuprofen.   Return to clinic in 1 yr for follow-up

## 2017-01-22 ENCOUNTER — Encounter: Payer: Medicare Other | Admitting: Adult Health

## 2017-03-03 ENCOUNTER — Telehealth: Payer: Self-pay

## 2017-03-03 NOTE — Telephone Encounter (Signed)
Called pt back to return her vm regarding not tolerating new anastrozole medication. Pt states that she took it for 1 month and had the same side effects as letrozole. Pt had severe joint pain, hot flashes, and stiffness. Pt stopped anastrozole x 1 week ago and side effects now resolving.   Pt does not want to try tamoxifen or any other medication at this time. Pt will call if she changes her mind, but would like to remain off of anti estrogens for now. Pt states that her chances of recurrence is 12% per Dr.Gudena's discussion with her during last visit. With anti estrogen use, recurrence reduced to 6%. Pt not willing to lose quality of life with dealing with the side effects.   Told pt that we will notify MD and make him aware of pt decision. Pt appreciative of call and has no further concerns at this time.

## 2017-06-16 ENCOUNTER — Telehealth: Payer: Self-pay | Admitting: Hematology and Oncology

## 2017-06-16 NOTE — Telephone Encounter (Signed)
Patient called to cancel °

## 2017-06-18 ENCOUNTER — Ambulatory Visit: Payer: Medicare Other | Admitting: Hematology and Oncology

## 2017-07-13 ENCOUNTER — Ambulatory Visit (HOSPITAL_COMMUNITY)
Admission: RE | Admit: 2017-07-13 | Discharge: 2017-07-13 | Disposition: A | Discharge status: Home / Self Care | Payer: Medicare Other | Source: Ambulatory Visit | Attending: Surgery | Admitting: Surgery

## 2017-07-13 ENCOUNTER — Other Ambulatory Visit (HOSPITAL_COMMUNITY): Payer: Self-pay | Admitting: Internal Medicine

## 2017-07-13 DIAGNOSIS — R0989 Other specified symptoms and signs involving the circulatory and respiratory systems: Secondary | ICD-10-CM | POA: Diagnosis not present

## 2017-07-13 DIAGNOSIS — I6523 Occlusion and stenosis of bilateral carotid arteries: Secondary | ICD-10-CM | POA: Diagnosis not present

## 2017-07-13 LAB — VAS US CAROTID
LCCAPDIAS: 24 cm/s
LCCAPSYS: 91 cm/s
LEFT ECA DIAS: -21 cm/s
LEFT VERTEBRAL DIAS: -8 cm/s
LICADDIAS: -29 cm/s
LICAPSYS: 291 cm/s
Left CCA dist dias: 26 cm/s
Left CCA dist sys: 81 cm/s
Left ICA dist sys: -86 cm/s
Left ICA prox dias: 129 cm/s
RCCADSYS: -114 cm/s
RCCAPSYS: 85 cm/s
RIGHT CCA MID DIAS: 26 cm/s
RIGHT ECA DIAS: -20 cm/s
RIGHT VERTEBRAL DIAS: -22 cm/s
Right CCA prox dias: 15 cm/s

## 2017-07-18 ENCOUNTER — Other Ambulatory Visit: Payer: Self-pay | Admitting: Hematology and Oncology

## 2017-07-20 ENCOUNTER — Encounter: Payer: Self-pay | Admitting: Vascular Surgery

## 2017-07-28 ENCOUNTER — Encounter: Payer: Self-pay | Admitting: *Deleted

## 2017-07-28 ENCOUNTER — Other Ambulatory Visit: Payer: Self-pay | Admitting: *Deleted

## 2017-07-28 ENCOUNTER — Encounter (HOSPITAL_COMMUNITY): Payer: Self-pay

## 2017-07-28 ENCOUNTER — Other Ambulatory Visit: Payer: Self-pay

## 2017-07-28 ENCOUNTER — Encounter: Payer: Self-pay | Admitting: Vascular Surgery

## 2017-07-28 ENCOUNTER — Encounter (HOSPITAL_COMMUNITY)
Admission: RE | Admit: 2017-07-28 | Discharge: 2017-07-28 | Disposition: A | Discharge status: Home / Self Care | Payer: Medicare Other | Source: Ambulatory Visit | Attending: Vascular Surgery | Admitting: Vascular Surgery

## 2017-07-28 ENCOUNTER — Ambulatory Visit: Payer: Medicare Other | Admitting: Vascular Surgery

## 2017-07-28 VITALS — BP 127/76 | HR 81 | Temp 97.8°F | Resp 14 | Ht 63.0 in | Wt 118.0 lb

## 2017-07-28 DIAGNOSIS — F17210 Nicotine dependence, cigarettes, uncomplicated: Secondary | ICD-10-CM | POA: Diagnosis not present

## 2017-07-28 DIAGNOSIS — Z01818 Encounter for other preprocedural examination: Secondary | ICD-10-CM | POA: Insufficient documentation

## 2017-07-28 DIAGNOSIS — Z79899 Other long term (current) drug therapy: Secondary | ICD-10-CM | POA: Insufficient documentation

## 2017-07-28 DIAGNOSIS — I6522 Occlusion and stenosis of left carotid artery: Secondary | ICD-10-CM

## 2017-07-28 DIAGNOSIS — Z7982 Long term (current) use of aspirin: Secondary | ICD-10-CM | POA: Diagnosis not present

## 2017-07-28 DIAGNOSIS — E785 Hyperlipidemia, unspecified: Secondary | ICD-10-CM | POA: Diagnosis not present

## 2017-07-28 DIAGNOSIS — I10 Essential (primary) hypertension: Secondary | ICD-10-CM | POA: Diagnosis not present

## 2017-07-28 HISTORY — DX: Personal history of urinary calculi: Z87.442

## 2017-07-28 LAB — CBC
HEMATOCRIT: 46.8 % — AB (ref 36.0–46.0)
HEMOGLOBIN: 15.5 g/dL — AB (ref 12.0–15.0)
MCH: 30.9 pg (ref 26.0–34.0)
MCHC: 33.1 g/dL (ref 30.0–36.0)
MCV: 93.4 fL (ref 78.0–100.0)
Platelets: 279 10*3/uL (ref 150–400)
RBC: 5.01 MIL/uL (ref 3.87–5.11)
RDW: 13.2 % (ref 11.5–15.5)
WBC: 8.5 10*3/uL (ref 4.0–10.5)

## 2017-07-28 LAB — URINALYSIS, ROUTINE W REFLEX MICROSCOPIC
BILIRUBIN URINE: NEGATIVE
Glucose, UA: NEGATIVE mg/dL
HGB URINE DIPSTICK: NEGATIVE
Ketones, ur: NEGATIVE mg/dL
Leukocytes, UA: NEGATIVE
NITRITE: NEGATIVE
PROTEIN: NEGATIVE mg/dL
Specific Gravity, Urine: 1.017 (ref 1.005–1.030)
pH: 5 (ref 5.0–8.0)

## 2017-07-28 LAB — COMPREHENSIVE METABOLIC PANEL
ALBUMIN: 3.6 g/dL (ref 3.5–5.0)
ALK PHOS: 83 U/L (ref 38–126)
ALT: 12 U/L — ABNORMAL LOW (ref 14–54)
ANION GAP: 12 (ref 5–15)
AST: 24 U/L (ref 15–41)
BUN: 7 mg/dL (ref 6–20)
CALCIUM: 9.4 mg/dL (ref 8.9–10.3)
CO2: 25 mmol/L (ref 22–32)
Chloride: 102 mmol/L (ref 101–111)
Creatinine, Ser: 0.74 mg/dL (ref 0.44–1.00)
GFR calc Af Amer: >60 mL/min (ref >60)
GFR calc non Af Amer: >60 mL/min (ref >60)
GLUCOSE: 139 mg/dL — AB (ref 65–99)
Potassium: 3.2 mmol/L — ABNORMAL LOW (ref 3.5–5.1)
SODIUM: 139 mmol/L (ref 135–145)
Total Bilirubin: 0.7 mg/dL (ref 0.3–1.2)
Total Protein: 6.8 g/dL (ref 6.5–8.1)

## 2017-07-28 LAB — ABO/RH: ABO/RH(D): A POS

## 2017-07-28 LAB — SURGICAL PCR SCREEN
MRSA, PCR: NEGATIVE
Staphylococcus aureus: NEGATIVE

## 2017-07-28 LAB — APTT: APTT: 32 s (ref 24–36)

## 2017-07-28 LAB — PROTIME-INR
INR: 0.98
Prothrombin Time: 12.9 seconds (ref 11.4–15.2)

## 2017-07-28 LAB — TYPE AND SCREEN
ABO/RH(D): A POS
ANTIBODY SCREEN: NEGATIVE

## 2017-07-28 MED ORDER — CHLORHEXIDINE GLUCONATE 4 % EX LIQD: 60.0000 mL | Freq: Once | CUTANEOUS | Status: DC

## 2017-07-28 NOTE — Pre-Procedure Instructions (Signed)
JIYAH TORPEY  07/28/2017      [place], [place]  [facility] [address] [place] [address] Phone: [phone] Fax: [phone]    Your procedure is scheduled on August 03, 2017.  Report to [facility] Admitting at 930 AM.  Call this number if you have problems the morning of surgery:  [phone]   Remember:  Do not eat food or drink liquids after midnight.  Take these medicines the morning of surgery with A SIP OF WATER cetirizine (zyrtec), fluticasone (flonase).  7 days prior to surgery STOP taking any Aleve, Naproxen, Ibuprofen, Motrin, Advil, Goody's, BC's, all herbal medications, fish oil, and all vitamins  Continue all other medications as instructed by your physician except follow the above medication instructions before surgery   Do not wear jewelry, make-up or nail polish.  Do not wear lotions, powders, or perfumes, or deodorant.  Do not shave 48 hours prior to surgery.    Do not bring valuables to the hospital.  [facility] is not responsible for any belongings or valuables.  Contacts, dentures or bridgework may not be worn into surgery.  Leave your suitcase in the car.  After surgery it may be brought to your room.  For patients admitted to the hospital, discharge time will be determined by your treatment team.  Patients discharged the day of surgery will not be allowed to drive home.   Special instructions:   [place]- Preparing For Surgery  Before surgery, you can play an important role. Because skin is not sterile, your skin needs to be as free of germs as possible. You can reduce the number of germs on your skin by washing with CHG (chlorahexidine gluconate) Soap before surgery.  CHG is an antiseptic cleaner which kills germs and bonds with the skin to continue killing germs even after washing.  Please do not use if you have an allergy to CHG or antibacterial soaps. If your skin becomes  reddened/irritated stop using the CHG.  Do not shave (including legs and underarms) for at least 48 hours prior to first CHG shower. It is OK to shave your face.  Please follow these instructions carefully.   1. Shower the NIGHT BEFORE SURGERY and the MORNING OF SURGERY with CHG.   2. If you chose to wash your hair, wash your hair first as usual with your normal shampoo.  3. After you shampoo, rinse your hair and body thoroughly to remove the shampoo.  4. Use CHG as you would any other liquid soap. You can apply CHG directly to the skin and wash gently with a scrungie or a clean washcloth.   5. Apply the CHG Soap to your body ONLY FROM THE NECK DOWN.  Do not use on open wounds or open sores. Avoid contact with your eyes, ears, mouth and genitals (private parts). Wash Face and genitals (private parts)  with your normal soap.  6. Wash thoroughly, paying special attention to the area where your surgery will be performed.  7. Thoroughly rinse your body with warm water from the neck down.  8. DO NOT shower/wash with your normal soap after using and rinsing off the CHG Soap.  9. Pat yourself dry with a CLEAN TOWEL.  10. Wear CLEAN PAJAMAS to bed the night before surgery, wear comfortable clothes the morning of surgery  11. Place CLEAN SHEETS on your bed the night of your first shower and DO NOT  SLEEP WITH PETS.  Day of Surgery: Do not apply any deodorants/lotions. Please wear clean clothes to the hospital/surgery center.    Please read over the following fact sheets that you were given. Pain Booklet, Coughing and Deep Breathing, MRSA Information and Surgical Site Infection Prevention

## 2017-07-28 NOTE — H&P (View-Only) (Signed)
[facility]  Patient name: Jenna Lowe MRN: [id number] DOB: [birth year] Sex: female  REASON FOR CONSULT: Asymptomatic left carotid stenosis  HPI: Jenna Lowe is a 72 y.o. female, who is in today for evaluation of asymptomatic left carotid stenosis.  She is very healthy female who initially had some irregularity in her carotid on a [employer] screening.  Was felt to have a carotid bruit and underwent additional studies which have shown a greater than 80% carotid stenosis on the left and moderate disease on the right.  Has had no prior neurologic deficits.  Specifically no amaurosis fugax, transient ischemic attack or stroke.  He reports that her mother had carotid surgery years ago and did well with the surgery.  She reports that she has been very healthy and has never spent a night in the hospital in her life.  She had outpatient breast surgery for a  stage breast cancer several years ago.  Denies any cardiac disease.  He does smoke cigarettes.Marland Kitchen  He is right-handed  Past Medical History:  Diagnosis Date  . Breast cancer of upper-outer quadrant of left female breast ([facility])   . Chronic kidney disease   . H/O seasonal allergies   . Hyperlipidemia   . Hypertension     Family History  Problem Relation Age of Onset  . Lung cancer Sister   . Heart disease Sister   . Hypertension Sister   . Cancer Sister   . Stroke Sister   . COPD Sister   . Kidney disease Mother   . Heart disease Father     SOCIAL HISTORY: Social History   Socioeconomic History  . Marital status: Widowed    Spouse name: Not on file  . Number of children: Not on file  . Years of education: Not on file  . Highest education level: Not on file  Social Needs  . Financial resource strain: Not on file  . Food insecurity - worry: Not on file  . Food insecurity - inability: Not on file  . Transportation needs - medical: Not on file  .  Transportation needs - non-medical: Not on file  Occupational History  . Not on file  Tobacco Use  . Smoking status: Current Every Day Smoker    Packs/day: 1.00    Years: 30.00    Pack years: 30.00    Types: Cigarettes  . Smokeless tobacco: Never Used  Substance and Sexual Activity  . Alcohol use: No  . Drug use: No  . Sexual activity: Not on file  Other Topics Concern  . Not on file  Social History Narrative  . Not on file    Allergies  Allergen Reactions  . Pseudoephedrine Other (See Comments)    Keep her awake  . Sulfa Antibiotics Itching  . Amoxicillin-Pot Clavulanate Diarrhea    Current Outpatient Medications  Medication Sig Dispense Refill  . aspirin 81 MG tablet Take 81 mg by mouth daily.    . cetirizine (ZYRTEC ALLERGY) 10 MG tablet Take 1 tablet (10 mg total) by mouth daily.    . fluticasone (FLONASE) 50 MCG/ACT nasal spray Place into both nostrils daily.    . hydrochlorothiazide (MICROZIDE) 12.5 MG capsule Take 1 capsule (12.5 mg total) by mouth daily.    Marland Kitchen ibuprofen (ADVIL,MOTRIN) 200 MG tablet Take 200 mg by mouth every 8 (eight) hours as needed for mild pain.    Marland Kitchen lisinopril (PRINIVIL,ZESTRIL) 20 MG tablet Take 20 mg by mouth daily.    Marland Kitchen  Multiple Vitamins-Minerals (MULTIVITAMIN WITH MINERALS) tablet Take 1 tablet by mouth daily.    . Omega-3 Fat Ac-Cholecalciferol (MINICAPS VITAMIN-D/OMEGA-3 PO) Take 1 capsule by mouth daily.    Marland Kitchen anastrozole (ARIMIDEX) 1 MG tablet TAKE 1 TABLET BY MOUTH  DAILY (Patient not taking: Reported on 07/28/2017) 90 tablet 3   No current facility-administered medications for this visit.     REVIEW OF SYSTEMS:  [X]  denotes positive finding, [ ]  denotes negative finding Cardiac  Comments:  Chest pain or chest pressure:    Shortness of breath upon exertion:    Short of breath when lying flat:    Irregular heart rhythm:        Vascular    Pain in calf, thigh, or hip brought on by ambulation:    Pain in feet at night that wakes you  up from your sleep:     Blood clot in your veins:    Leg swelling:         Pulmonary    Oxygen at home:    Productive cough:     Wheezing:         Neurologic    Sudden weakness in arms or legs:     Sudden numbness in arms or legs:     Sudden onset of difficulty speaking or slurred speech:    Temporary loss of vision in one eye:     Problems with dizziness:         Gastrointestinal    Blood in stool:     Vomited blood:         Genitourinary    Burning when urinating:     Blood in urine:        Psychiatric    Major depression:         Hematologic    Bleeding problems:    Problems with blood clotting too easily:        Skin    Rashes or ulcers:        Constitutional    Fever or chills:      PHYSICAL EXAM: Vitals:   07/28/17 1103 07/28/17 1106  BP: 126/82 127/76  Pulse: 85 81  Resp: 14   Temp: 97.8 F (36.6 C)   TempSrc: Oral   SpO2: 97%   Weight: 118 lb (53.5 kg)   Height: 5\' 3"  (1.6 m)     GENERAL: The patient is a well-nourished female, in no acute distress. The vital signs are documented above. CARDIOVASCULAR: She does have a soft left carotid bruit and no bruit on the right.  2+ radial 2+ femoral and 2+ dorsalis pedis pulses bilaterally PULMONARY: There is good air exchange  ABDOMEN: Soft and non-tender.  No any palpable MUSCULOSKELETAL: There are no major deformities or cyanosis. NEUROLOGIC: No focal weakness or paresthesias are detected. SKIN: There are no ulcers or rashes noted. PSYCHIATRIC: The patient has a normal affect.  DATA:  Carotid duplex reveals greater than 80% left carotid stenosis with markedly elevated end diastolic velocities the internal carotid artery becomes normal above the carotid stenosis.  MEDICAL ISSUES: I had long discussion with the patient regarding the significance of her high-grade asymptomatic disease.  I explained to predicted 5 %/year risk for neurologic deficit based on her high-grade asymptomatic disease.  Also  discussed left carotid endarterectomy for reduction of stroke risk.  Explained 1-1-1/2% risk of stroke with surgery and nerve injury.  Have recommended surgery due to her excellent health.  She understands the procedure would be  an overnight stay in the hospital with plan for discharge home on postoperative day 1.  She wishes to proceed as soon as possible.   Rosetta Posner, MD FACS [facility] Tel [phone] Pager [phone]

## 2017-07-28 NOTE — Progress Notes (Signed)
[facility] and [facility]in Specialist of [facility]  Patient name: Jenna Lowe MRN: [id number] DOB: Jul 01, 1945 Sex: female  REASON FOR CONSULT: Asymptomatic left carotid stenosis  HPI: Jenna Lowe is a 72 y.o. female, who is in today for evaluation of asymptomatic left carotid stenosis.  She is very healthy female who initially had some irregularity in her carotid on a [employer] screening.  Was felt to have a carotid bruit and underwent additional studies which have shown a greater than 80% carotid stenosis on the left and moderate disease on the right.  Has had no prior neurologic deficits.  Specifically no amaurosis fugax, transient ischemic attack or stroke.  He reports that her mother had carotid surgery years ago and did well with the surgery.  She reports that she has been very healthy and has never spent a night in the hospital in her life.  She had outpatient breast surgery for a Kaiya Boatman stage breast cancer several years ago.  Denies any cardiac disease.  He does smoke cigarettes.Marland Kitchen  He is right-handed  Past Medical History:  Diagnosis Date  . Breast cancer of upper-outer quadrant of left female breast ([place])   . Chronic kidney disease   . H/O seasonal allergies   . Hyperlipidemia   . Hypertension     Family History  Problem Relation Age of Onset  . Lung cancer Sister   . Heart disease Sister   . Hypertension Sister   . Cancer Sister   . Stroke Sister   . COPD Sister   . Kidney disease Mother   . Heart disease Father     SOCIAL HISTORY: Social History   Socioeconomic History  . Marital status: Widowed    Spouse name: Not on file  . Number of children: Not on file  . Years of education: Not on file  . Highest education level: Not on file  Social Needs  . Financial resource strain: Not on file  . Food insecurity - worry: Not on file  . Food insecurity - inability: Not on file  . Transportation needs - medical: Not on file  .  Transportation needs - non-medical: Not on file  Occupational History  . Not on file  Tobacco Use  . Smoking status: Current Every Day Smoker    Packs/day: 1.00    Years: 30.00    Pack years: 30.00    Types: Cigarettes  . Smokeless tobacco: Never Used  Substance and Sexual Activity  . Alcohol use: No  . Drug use: No  . Sexual activity: Not on file  Other Topics Concern  . Not on file  Social History Narrative  . Not on file    Allergies  Allergen Reactions  . Pseudoephedrine Other (See Comments)    Keep her awake  . Sulfa Antibiotics Itching  . Amoxicillin-Pot Clavulanate Diarrhea    Current Outpatient Medications  Medication Sig Dispense Refill  . aspirin 81 MG tablet Take 81 mg by mouth daily.    . cetirizine (ZYRTEC ALLERGY) 10 MG tablet Take 1 tablet (10 mg total) by mouth daily.    . fluticasone (FLONASE) 50 MCG/ACT nasal spray Place into both nostrils daily.    . hydrochlorothiazide (MICROZIDE) 12.5 MG capsule Take 1 capsule (12.5 mg total) by mouth daily.    Marland Kitchen ibuprofen (ADVIL,MOTRIN) 200 MG tablet Take 200 mg by mouth every 8 (eight) hours as needed for mild pain.    Marland Kitchen lisinopril (PRINIVIL,ZESTRIL) 20 MG tablet Take 20 mg by mouth daily.    Marland Kitchen  Multiple Vitamins-Minerals (MULTIVITAMIN WITH MINERALS) tablet Take 1 tablet by mouth daily.    . Omega-3 Fat Ac-Cholecalciferol (MINICAPS VITAMIN-D/OMEGA-3 PO) Take 1 capsule by mouth daily.    Marland Kitchen anastrozole (ARIMIDEX) 1 MG tablet TAKE 1 TABLET BY MOUTH  DAILY (Patient not taking: Reported on 07/28/2017) 90 tablet 3   No current facility-administered medications for this visit.     REVIEW OF SYSTEMS:  [X]  denotes positive finding, [ ]  denotes negative finding Cardiac  Comments:  Chest pain or chest pressure:    Shortness of breath upon exertion:    Short of breath when lying flat:    Irregular heart rhythm:        Vascular    Pain in calf, thigh, or hip brought on by ambulation:    Pain in feet at night that wakes you  up from your sleep:     Blood clot in your veins:    Leg swelling:         Pulmonary    Oxygen at home:    Productive cough:     Wheezing:         Neurologic    Sudden weakness in arms or legs:     Sudden numbness in arms or legs:     Sudden onset of difficulty speaking or slurred speech:    Temporary loss of vision in one eye:     Problems with dizziness:         Gastrointestinal    Blood in stool:     Vomited blood:         Genitourinary    Burning when urinating:     Blood in urine:        Psychiatric    Major depression:         Hematologic    Bleeding problems:    Problems with blood clotting too easily:        Skin    Rashes or ulcers:        Constitutional    Fever or chills:      PHYSICAL EXAM: Vitals:   07/28/17 1103 07/28/17 1106  BP: 126/82 127/76  Pulse: 85 81  Resp: 14   Temp: 97.8 F (36.6 C)   TempSrc: Oral   SpO2: 97%   Weight: 118 lb (53.5 kg)   Height: 5\' 3"  (1.6 m)     GENERAL: The patient is a well-nourished female, in no acute distress. The vital signs are documented above. CARDIOVASCULAR: She does have a soft left carotid bruit and no bruit on the right.  2+ radial 2+ femoral and 2+ dorsalis pedis pulses bilaterally PULMONARY: There is good air exchange  ABDOMEN: Soft and non-tender.  No any palpable MUSCULOSKELETAL: There are no major deformities or cyanosis. NEUROLOGIC: No focal weakness or paresthesias are detected. SKIN: There are no ulcers or rashes noted. PSYCHIATRIC: The patient has a normal affect.  DATA:  Carotid duplex reveals greater than 80% left carotid stenosis with markedly elevated end diastolic velocities the internal carotid artery becomes normal above the carotid stenosis.  MEDICAL ISSUES: I had long discussion with the patient regarding the significance of her high-grade asymptomatic disease.  I explained to predicted 5 %/year risk for neurologic deficit based on her high-grade asymptomatic disease.  Also  discussed left carotid endarterectomy for reduction of stroke risk.  Explained 1-1-1/2% risk of stroke with surgery and nerve injury.  Have recommended surgery due to her excellent health.  She understands the procedure would be  an overnight stay in the hospital with plan for discharge home on postoperative day 1.  She wishes to proceed as soon as possible.   Rosetta Posner, MD FACS [facility] Tel [phone] Pager [phone]

## 2017-07-28 NOTE — Progress Notes (Signed)
PCP: Dr. Steva Ready, MD  Cardiologist: pt denies  EKG: pt denies past year, obtained today  Stress test: pt denies ever  ECHO: pt denies ever  Cardiac Cath: pt denies ever  Chest x-ray: pt denies past year, no recent respiratory complications/infections

## 2017-08-03 ENCOUNTER — Other Ambulatory Visit: Payer: Self-pay

## 2017-08-03 ENCOUNTER — Encounter (HOSPITAL_COMMUNITY): Payer: Self-pay | Admitting: Anesthesiology

## 2017-08-03 ENCOUNTER — Inpatient Hospital Stay (HOSPITAL_COMMUNITY)
Admission: RE | Admit: 2017-08-03 | Discharge: 2017-08-04 | DRG: 039 | Disposition: A | Discharge status: Home / Self Care | Payer: Medicare Other | Source: Ambulatory Visit | Attending: Vascular Surgery | Admitting: Vascular Surgery

## 2017-08-03 ENCOUNTER — Encounter (HOSPITAL_COMMUNITY)
Admission: RE | Disposition: A | Discharge status: Home / Self Care | Payer: Self-pay | Source: Ambulatory Visit | Attending: Vascular Surgery

## 2017-08-03 ENCOUNTER — Inpatient Hospital Stay (HOSPITAL_COMMUNITY): Payer: Medicare Other | Admitting: Anesthesiology

## 2017-08-03 DIAGNOSIS — I6522 Occlusion and stenosis of left carotid artery: Principal | ICD-10-CM | POA: Diagnosis present

## 2017-08-03 DIAGNOSIS — Z853 Personal history of malignant neoplasm of breast: Secondary | ICD-10-CM

## 2017-08-03 DIAGNOSIS — F17210 Nicotine dependence, cigarettes, uncomplicated: Secondary | ICD-10-CM | POA: Diagnosis present

## 2017-08-03 DIAGNOSIS — Z7982 Long term (current) use of aspirin: Secondary | ICD-10-CM | POA: Diagnosis not present

## 2017-08-03 DIAGNOSIS — Z880 Allergy status to penicillin: Secondary | ICD-10-CM | POA: Diagnosis not present

## 2017-08-03 DIAGNOSIS — Z882 Allergy status to sulfonamides status: Secondary | ICD-10-CM

## 2017-08-03 DIAGNOSIS — N189 Chronic kidney disease, unspecified: Secondary | ICD-10-CM | POA: Diagnosis present

## 2017-08-03 DIAGNOSIS — I129 Hypertensive chronic kidney disease with stage 1 through stage 4 chronic kidney disease, or unspecified chronic kidney disease: Secondary | ICD-10-CM | POA: Diagnosis present

## 2017-08-03 DIAGNOSIS — I6529 Occlusion and stenosis of unspecified carotid artery: Secondary | ICD-10-CM | POA: Diagnosis present

## 2017-08-03 DIAGNOSIS — E785 Hyperlipidemia, unspecified: Secondary | ICD-10-CM | POA: Diagnosis present

## 2017-08-03 DIAGNOSIS — Z888 Allergy status to other drugs, medicaments and biological substances status: Secondary | ICD-10-CM | POA: Diagnosis not present

## 2017-08-03 DIAGNOSIS — Z79899 Other long term (current) drug therapy: Secondary | ICD-10-CM | POA: Diagnosis not present

## 2017-08-03 HISTORY — PX: ENDARTERECTOMY: SHX5162

## 2017-08-03 SURGERY — ENDARTERECTOMY, CAROTID
Anesthesia: General | Laterality: Left

## 2017-08-03 MED ORDER — ALBUMIN HUMAN 5 % IV SOLN
INTRAVENOUS | Status: AC
  Administered 2017-08-03: 12.5 g via INTRAVENOUS
  Filled 2017-08-03: qty 250

## 2017-08-03 MED ORDER — OXYCODONE HCL 5 MG/5ML PO SOLN
5.0000 mg | Freq: Once | ORAL | Status: DC | PRN
Start: 2017-08-03 — End: 2017-08-03

## 2017-08-03 MED ORDER — SODIUM CHLORIDE 0.9 % IV SOLN
500.0000 mL | Freq: Once | INTRAVENOUS | Status: AC | PRN
  Administered 2017-08-03: 500 mL via INTRAVENOUS

## 2017-08-03 MED ORDER — CLINDAMYCIN PHOSPHATE 300 MG/50ML IV SOLN
300.0000 mg | Freq: Four times a day (QID) | INTRAVENOUS | Status: AC
  Administered 2017-08-03 – 2017-08-04 (×2): 300 mg via INTRAVENOUS
  Filled 2017-08-03 (×2): qty 50

## 2017-08-03 MED ORDER — LIDOCAINE HCL (PF) 1 % IJ SOLN
INTRAMUSCULAR | Status: AC
  Filled 2017-08-03: qty 30

## 2017-08-03 MED ORDER — ONDANSETRON HCL 4 MG/2ML IJ SOLN: 4.0000 mg | Freq: Four times a day (QID) | INTRAMUSCULAR | Status: DC | PRN

## 2017-08-03 MED ORDER — SODIUM CHLORIDE 0.9 % IV SOLN
INTRAVENOUS | Status: DC | PRN
  Administered 2017-08-03: 20 mL

## 2017-08-03 MED ORDER — ALBUMIN HUMAN 25 % IV SOLN
12.5000 g | Freq: Once | INTRAVENOUS | Status: DC
  Filled 2017-08-03: qty 50

## 2017-08-03 MED ORDER — OXYCODONE HCL 5 MG PO TABS: 5.0000 mg | ORAL_TABLET | Freq: Once | ORAL | Status: DC | PRN

## 2017-08-03 MED ORDER — SUGAMMADEX SODIUM 200 MG/2ML IV SOLN
INTRAVENOUS | Status: DC | PRN
  Administered 2017-08-03: 200 mg via INTRAVENOUS

## 2017-08-03 MED ORDER — ACETAMINOPHEN 325 MG PO TABS
325.0000 mg | ORAL_TABLET | ORAL | Status: DC | PRN
  Administered 2017-08-04: 650 mg via ORAL
  Filled 2017-08-03: qty 2

## 2017-08-03 MED ORDER — ALUM & MAG HYDROXIDE-SIMETH 200-200-20 MG/5ML PO SUSP: 15.0000 mL | ORAL | Status: DC | PRN

## 2017-08-03 MED ORDER — SODIUM CHLORIDE 0.9 % IV SOLN
0.0125 ug/kg/min | INTRAVENOUS | Status: DC
  Filled 2017-08-03: qty 2000

## 2017-08-03 MED ORDER — FENTANYL CITRATE (PF) 100 MCG/2ML IJ SOLN: 25.0000 ug | INTRAMUSCULAR | Status: DC | PRN

## 2017-08-03 MED ORDER — 0.9 % SODIUM CHLORIDE (POUR BTL) OPTIME
TOPICAL | Status: DC | PRN
  Administered 2017-08-03: 1000 mL

## 2017-08-03 MED ORDER — ROCURONIUM BROMIDE 100 MG/10ML IV SOLN
INTRAVENOUS | Status: DC | PRN
Start: 2017-08-03 — End: 2017-08-03
  Administered 2017-08-03: 40 mg via INTRAVENOUS

## 2017-08-03 MED ORDER — HYDRALAZINE HCL 20 MG/ML IJ SOLN: 5.0000 mg | INTRAMUSCULAR | Status: DC | PRN

## 2017-08-03 MED ORDER — ALBUMIN HUMAN 5 % IV SOLN: 12.5000 g | Freq: Once | INTRAVENOUS | Status: DC

## 2017-08-03 MED ORDER — SODIUM CHLORIDE 0.9 % IV SOLN: INTRAVENOUS | Status: DC

## 2017-08-03 MED ORDER — DEXAMETHASONE SODIUM PHOSPHATE 10 MG/ML IJ SOLN
INTRAMUSCULAR | Status: AC
  Filled 2017-08-03: qty 1

## 2017-08-03 MED ORDER — LABETALOL HCL 5 MG/ML IV SOLN: 10.0000 mg | INTRAVENOUS | Status: DC | PRN

## 2017-08-03 MED ORDER — PHENYLEPHRINE HCL 10 MG/ML IJ SOLN
INTRAVENOUS | Status: DC | PRN
  Administered 2017-08-03: 100 ug/min via INTRAVENOUS

## 2017-08-03 MED ORDER — PANTOPRAZOLE SODIUM 40 MG PO TBEC
40.0000 mg | DELAYED_RELEASE_TABLET | Freq: Every day | ORAL | Status: DC
  Administered 2017-08-03: 40 mg via ORAL
  Filled 2017-08-03: qty 1

## 2017-08-03 MED ORDER — PROPOFOL 10 MG/ML IV BOLUS
INTRAVENOUS | Status: DC | PRN
  Administered 2017-08-03: 120 mg via INTRAVENOUS

## 2017-08-03 MED ORDER — ROCURONIUM BROMIDE 10 MG/ML (PF) SYRINGE
PREFILLED_SYRINGE | INTRAVENOUS | Status: AC
  Filled 2017-08-03: qty 5

## 2017-08-03 MED ORDER — MAGNESIUM SULFATE 2 GM/50ML IV SOLN: 2.0000 g | Freq: Every day | INTRAVENOUS | Status: DC | PRN

## 2017-08-03 MED ORDER — DOCUSATE SODIUM 100 MG PO CAPS
100.0000 mg | ORAL_CAPSULE | Freq: Every day | ORAL | Status: DC
  Administered 2017-08-03: 100 mg via ORAL
  Filled 2017-08-03: qty 1

## 2017-08-03 MED ORDER — PROTAMINE SULFATE 10 MG/ML IV SOLN
INTRAVENOUS | Status: DC | PRN
  Administered 2017-08-03: 50 mg via INTRAVENOUS

## 2017-08-03 MED ORDER — ACETAMINOPHEN 650 MG RE SUPP: 325.0000 mg | RECTAL | Status: DC | PRN

## 2017-08-03 MED ORDER — HEPARIN SODIUM (PORCINE) 1000 UNIT/ML IJ SOLN
INTRAMUSCULAR | Status: DC | PRN
  Administered 2017-08-03: 6000 [IU] via INTRAVENOUS

## 2017-08-03 MED ORDER — OXYCODONE-ACETAMINOPHEN 5-325 MG PO TABS: 1.0000 | ORAL_TABLET | ORAL | Status: DC | PRN

## 2017-08-03 MED ORDER — LACTATED RINGERS IV SOLN
INTRAVENOUS | Status: DC
  Administered 2017-08-03: 09:00:00 via INTRAVENOUS

## 2017-08-03 MED ORDER — SUGAMMADEX SODIUM 200 MG/2ML IV SOLN
INTRAVENOUS | Status: AC
  Filled 2017-08-03: qty 2

## 2017-08-03 MED ORDER — SODIUM CHLORIDE 0.9 % IV SOLN
0.0000 ug/min | INTRAVENOUS | Status: DC
  Administered 2017-08-03: 25 ug/min via INTRAVENOUS

## 2017-08-03 MED ORDER — DEXAMETHASONE SODIUM PHOSPHATE 4 MG/ML IJ SOLN
INTRAMUSCULAR | Status: DC | PRN
  Administered 2017-08-03: 10 mg via INTRAVENOUS

## 2017-08-03 MED ORDER — VANCOMYCIN HCL IN DEXTROSE 1-5 GM/200ML-% IV SOLN
1000.0000 mg | INTRAVENOUS | Status: AC
  Administered 2017-08-03: 1000 mg via INTRAVENOUS
  Filled 2017-08-03: qty 200

## 2017-08-03 MED ORDER — ALBUMIN HUMAN 5 % IV SOLN
INTRAVENOUS | Status: AC
  Administered 2017-08-03: 12.5 g
  Filled 2017-08-03: qty 250

## 2017-08-03 MED ORDER — EPHEDRINE SULFATE 50 MG/ML IJ SOLN
INTRAMUSCULAR | Status: DC | PRN
  Administered 2017-08-03 (×3): 5 mg via INTRAVENOUS

## 2017-08-03 MED ORDER — HYDROCHLOROTHIAZIDE 12.5 MG PO CAPS
12.5000 mg | ORAL_CAPSULE | Freq: Every day | ORAL | Status: DC
  Administered 2017-08-03: 12.5 mg via ORAL
  Filled 2017-08-03: qty 1

## 2017-08-03 MED ORDER — FLUTICASONE PROPIONATE 50 MCG/ACT NA SUSP
1.0000 | Freq: Every day | NASAL | Status: DC
  Filled 2017-08-03: qty 16

## 2017-08-03 MED ORDER — LIDOCAINE 2% (20 MG/ML) 5 ML SYRINGE
INTRAMUSCULAR | Status: AC
Start: 2017-08-03 — End: 2017-08-03
  Filled 2017-08-03: qty 5

## 2017-08-03 MED ORDER — LIDOCAINE 2% (20 MG/ML) 5 ML SYRINGE
INTRAMUSCULAR | Status: DC | PRN
  Administered 2017-08-03: 60 mg via INTRAVENOUS

## 2017-08-03 MED ORDER — POLYETHYLENE GLYCOL 3350 17 G PO PACK: 17.0000 g | PACK | Freq: Every day | ORAL | Status: DC | PRN

## 2017-08-03 MED ORDER — MORPHINE SULFATE (PF) 4 MG/ML IV SOLN: 4.0000 mg | INTRAVENOUS | Status: DC | PRN

## 2017-08-03 MED ORDER — FENTANYL CITRATE (PF) 100 MCG/2ML IJ SOLN
INTRAMUSCULAR | Status: DC | PRN
  Administered 2017-08-03: 25 ug via INTRAVENOUS
  Administered 2017-08-03: 75 ug via INTRAVENOUS

## 2017-08-03 MED ORDER — REMIFENTANIL HCL 2 MG IV SOLR
INTRAVENOUS | Status: DC | PRN
  Administered 2017-08-03: .1 ug/kg/min via INTRAVENOUS

## 2017-08-03 MED ORDER — FENTANYL CITRATE (PF) 250 MCG/5ML IJ SOLN
INTRAMUSCULAR | Status: AC
  Filled 2017-08-03: qty 5

## 2017-08-03 MED ORDER — GUAIFENESIN-DM 100-10 MG/5ML PO SYRP: 15.0000 mL | ORAL_SOLUTION | ORAL | Status: DC | PRN

## 2017-08-03 MED ORDER — ONDANSETRON HCL 4 MG/2ML IJ SOLN: 4.0000 mg | Freq: Once | INTRAMUSCULAR | Status: DC | PRN

## 2017-08-03 MED ORDER — POTASSIUM CHLORIDE CRYS ER 20 MEQ PO TBCR: 20.0000 meq | EXTENDED_RELEASE_TABLET | Freq: Every day | ORAL | Status: DC | PRN

## 2017-08-03 MED ORDER — ALBUMIN HUMAN 5 % IV SOLN
12.5000 g | Freq: Once | INTRAVENOUS | Status: AC
  Administered 2017-08-03: 12.5 g via INTRAVENOUS

## 2017-08-03 MED ORDER — LORATADINE 10 MG PO TABS
10.0000 mg | ORAL_TABLET | Freq: Every day | ORAL | Status: DC
  Administered 2017-08-03: 10 mg via ORAL
  Filled 2017-08-03: qty 1

## 2017-08-03 MED ORDER — EPHEDRINE 5 MG/ML INJ
INTRAVENOUS | Status: AC
  Filled 2017-08-03: qty 10

## 2017-08-03 MED ORDER — LISINOPRIL 10 MG PO TABS: 20.0000 mg | ORAL_TABLET | Freq: Every day | ORAL | Status: DC

## 2017-08-03 MED ORDER — METOPROLOL TARTRATE 5 MG/5ML IV SOLN: 2.0000 mg | INTRAVENOUS | Status: DC | PRN

## 2017-08-03 MED ORDER — PHENOL 1.4 % MT LIQD: 1.0000 | OROMUCOSAL | Status: DC | PRN

## 2017-08-03 MED ORDER — ASPIRIN EC 81 MG PO TBEC
81.0000 mg | DELAYED_RELEASE_TABLET | Freq: Every day | ORAL | Status: DC
  Administered 2017-08-03: 81 mg via ORAL
  Filled 2017-08-03: qty 1

## 2017-08-03 MED ORDER — ONDANSETRON HCL 4 MG/2ML IJ SOLN
INTRAMUSCULAR | Status: AC
  Filled 2017-08-03: qty 2

## 2017-08-03 SURGICAL SUPPLY — 44 items
ADH SKN CLS APL DERMABOND .7 (GAUZE/BANDAGES/DRESSINGS) ×1
ADH SKN CLS LQ APL DERMABOND (GAUZE/BANDAGES/DRESSINGS) ×1
CANISTER SUCT 3000ML PPV (MISCELLANEOUS) ×2 IMPLANT
CANNULA VESSEL 3MM 2 BLNT TIP (CANNULA) ×4 IMPLANT
CATH ROBINSON RED A/P 18FR (CATHETERS) ×2 IMPLANT
CLIP LIGATING EXTRA MED SLVR (CLIP) ×2 IMPLANT
CLIP LIGATING EXTRA SM BLUE (MISCELLANEOUS) ×2 IMPLANT
CRADLE DONUT ADULT HEAD (MISCELLANEOUS) ×2 IMPLANT
DECANTER SPIKE VIAL GLASS SM (MISCELLANEOUS) IMPLANT
DERMABOND ADHESIVE PROPEN (GAUZE/BANDAGES/DRESSINGS) ×1
DERMABOND ADVANCED (GAUZE/BANDAGES/DRESSINGS) ×1
DERMABOND ADVANCED .7 DNX12 (GAUZE/BANDAGES/DRESSINGS) ×1 IMPLANT
DERMABOND ADVANCED .7 DNX6 (GAUZE/BANDAGES/DRESSINGS) IMPLANT
DRAIN HEMOVAC 1/8 X 5 (WOUND CARE) IMPLANT
ELECT REM PT RETURN 9FT ADLT (ELECTROSURGICAL) ×2
ELECTRODE REM PT RTRN 9FT ADLT (ELECTROSURGICAL) ×1 IMPLANT
EVACUATOR SILICONE 100CC (DRAIN) IMPLANT
GLOVE BIO SURGEON STRL SZ7 (GLOVE) ×1 IMPLANT
GLOVE SS BIOGEL STRL SZ 7.5 (GLOVE) ×1 IMPLANT
GLOVE SUPERSENSE BIOGEL SZ 7.5 (GLOVE) ×1
GOWN STRL REUS W/ TWL LRG LVL3 (GOWN DISPOSABLE) ×3 IMPLANT
GOWN STRL REUS W/ TWL XL LVL3 (GOWN DISPOSABLE) IMPLANT
GOWN STRL REUS W/TWL LRG LVL3 (GOWN DISPOSABLE) ×6
GOWN STRL REUS W/TWL XL LVL3 (GOWN DISPOSABLE) ×2
KIT BASIN OR (CUSTOM PROCEDURE TRAY) ×2 IMPLANT
KIT ROOM TURNOVER OR (KITS) ×2 IMPLANT
KIT SHUNT ARGYLE CAROTID ART 6 (VASCULAR PRODUCTS) IMPLANT
NEEDLE 22X1 1/2 (OR ONLY) (NEEDLE) IMPLANT
NS IRRIG 1000ML POUR BTL (IV SOLUTION) ×4 IMPLANT
PACK CAROTID (CUSTOM PROCEDURE TRAY) ×2 IMPLANT
PAD ARMBOARD 7.5X6 YLW CONV (MISCELLANEOUS) ×4 IMPLANT
PATCH HEMASHIELD 8X75 (Vascular Products) ×1 IMPLANT
SHUNT CAROTID BYPASS 10 (VASCULAR PRODUCTS) ×1 IMPLANT
SHUNT CAROTID BYPASS 12FRX15.5 (VASCULAR PRODUCTS) IMPLANT
SUT ETHILON 3 0 PS 1 (SUTURE) IMPLANT
SUT PROLENE 6 0 CC (SUTURE) ×4 IMPLANT
SUT SILK 3 0 (SUTURE)
SUT SILK 3-0 18XBRD TIE 12 (SUTURE) IMPLANT
SUT VIC AB 3-0 SH 27 (SUTURE) ×4
SUT VIC AB 3-0 SH 27X BRD (SUTURE) ×2 IMPLANT
SUT VICRYL 4-0 PS2 18IN ABS (SUTURE) ×2 IMPLANT
SYR CONTROL 10ML LL (SYRINGE) IMPLANT
TOWEL GREEN STERILE (TOWEL DISPOSABLE) ×2 IMPLANT
WATER STERILE IRR 1000ML POUR (IV SOLUTION) ×2 IMPLANT

## 2017-08-03 NOTE — Progress Notes (Signed)
[facility]in Specialists of [place]  Subjective  - Doing well.   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 Left neck incision C/D/I without hematoma  Assessment/Planning: S/P left CEA  Stable disposition voided and alert and oriented x 3.  Roxy Horseman 08/03/2017 4:49 PM --  Laboratory Lab Results: No results for input(s): WBC, HGB, HCT, PLT in the last 72 hours. BMET No results for input(s): NA, K, CL, CO2, GLUCOSE, BUN, CREATININE, CALCIUM in the last 72 hours.  COAG Lab Results  Component Value Date   INR 0.98 07/28/2017   No results found for: PTT

## 2017-08-03 NOTE — Anesthesia Postprocedure Evaluation (Signed)
Anesthesia Post Note  Patient: FANCY DUNKLEY  Procedure(s) Performed: ENDARTERECTOMY CAROTID LEFT (Left )     Patient location during evaluation: PACU Anesthesia Type: General Level of consciousness: awake and alert Pain management: pain level controlled Vital Signs Assessment: post-procedure vital signs reviewed and stable Respiratory status: spontaneous breathing, nonlabored ventilation, respiratory function stable and patient connected to nasal cannula oxygen Cardiovascular status: blood pressure returned to baseline and stable Postop Assessment: no apparent nausea or vomiting Anesthetic complications: no    Last Vitals:  Vitals:   08/03/17 1630 08/03/17 1645  BP: 96/61   Pulse:    Resp: 19 19  Temp:    SpO2: (!) 82% 94%    Last Pain:  Vitals:   08/03/17 1545  TempSrc: Oral  PainSc: 0-No pain                 Shirell Struthers COKER

## 2017-08-03 NOTE — Op Note (Signed)
    OPERATIVE REPORT  DATE OF SURGERY: 08/03/2017  PATIENT: Jenna Lowe, 72 y.o. female MRN: [id number]  DOB: Jun 07, 1945  PRE-OPERATIVE DIAGNOSIS: Severe asymptomatic left internal carotid artery stenosis  POST-OPERATIVE DIAGNOSIS:  Same  PROCEDURE: Left carotid endarterectomy and Dacron patch angioplasty  SURGEON:  Curt Jews, M.D.  PHYSICIAN ASSISTANT: Matt Eveland PA-C  ANESTHESIA: General  EBL: Minimal ml  Total I/O In: 600 [I.V.:600] Out: 5 [Blood:5]  BLOOD ADMINISTERED: None  DRAINS: None  SPECIMEN: None  COUNTS CORRECT:  YES  PLAN OF CARE: PACU neurologically intact  PATIENT DISPOSITION:  PACU - hemodynamically stable  PROCEDURE DETAILS: The patient was taken to the operating placed supine position where the area of the left neck was prepped and draped in usual sterile fashion.  An incision was made anterior to the sternocleidomastoid and carried down through the platysma with electrocautery.  The sternocleidomastoid was reflected posteriorly and the carotid sheath was opened.  The facial vein was ligated and divided.  The vagus and hypoglossal nerves were identified and preserved.  The patient had a relatively low carotid bifurcation and the plaque was focal at the bifurcation.  The internal carotid artery was encircled with a umbilical tape and Rummel tourniquet.  The external carotid was encircled with a blue vessel loop and the superior thyroid artery was encircled with a 2-0 silk Potts tie.  The common carotid artery was encircled with a umbilical tape and Rummel tourniquet.  The patient was given 6000 units of intravenous heparin and after adequate circulation time the internal/external and common carotid arteries were occluded.  The common carotid artery was opened within the 11 blade and extended longitudinally with Potts scissors onto the internal carotid artery with Potts scissors.  A 10 shunt was passed up the internal carotid and allowed to backbleed and  then down the common carotid where it was secured with a Rummel tourniquet.  The endarterectomy was begun on the common carotid artery and the plaque was divided proximally with Potts scissors.  The endarterectomy was continued onto the bifurcation and the external carotid was endarterectomized with an eversion technique and the internal carotid was endarterectomized in an open fashion.  Remaining atheromatous debris was removed from the endarterectomy plane.  A Finesse Hemashield Dacron patch was brought onto the field and was sewn as a patch angioplasty with a running 6-0 Prolene suture.  Prior to completion of the closure the shunt was removed in the usual flushing maneuvers were undertaken.  The anastomosis was completed and flow was restored first to the external then the internal carotid arteries.  Excellent flow characteristics were noted with hand-held Doppler in the internal and external carotid arteries.  The patient was given 50 mg of protamine to reverse with the heparin.  The wounds irrigated with saline.  Hemostasis obtained with cautery.  The wounds were closed with 3-0 Vicryl to reapproximate the sternocleidomastoid over the carotid sheath.  Next the platysma was closed with a running 3-0 Vicryl suture and finally the skin was closed with a 4-0 subcuticular Vicryl stitch.  Sterile dressing was applied.  The patient was awake and neurologically intact in the operating room and transferred to the recovery room in stable condition   Rosetta Posner, M.D., [facility] 08/03/2017 11:41 AM

## 2017-08-03 NOTE — Anesthesia Procedure Notes (Signed)
Procedure Name: Intubation Date/Time: 08/03/2017 9:32 AM Performed by: Lieutenant Diego, CRNA Pre-anesthesia Checklist: Patient identified, Emergency Drugs available, Suction available and Patient being monitored Patient Re-evaluated:Patient Re-evaluated prior to induction Oxygen Delivery Method: Circle system utilized Preoxygenation: Pre-oxygenation with 100% oxygen Induction Type: IV induction Ventilation: Mask ventilation without difficulty Laryngoscope Size: Miller and 2 Grade View: Grade I Tube type: Oral Tube size: 7.0 mm Number of attempts: 1 Airway Equipment and Method: Stylet and Oral airway Placement Confirmation: ETT inserted through vocal cords under direct vision,  positive ETCO2 and breath sounds checked- equal and bilateral Secured at: 22 cm Tube secured with: Tape Dental Injury: Teeth and Oropharynx as per pre-operative assessment

## 2017-08-03 NOTE — Progress Notes (Signed)
Patient said that she takes all her medications at bedtime, pharmacy notified to rescheduled meds, will continue to monitor.

## 2017-08-03 NOTE — Interval H&P Note (Signed)
History and Physical Interval Note:  08/03/2017 8:50 AM  Jenna Lowe  has presented today for surgery, with the diagnosis of LEFT CAROTID STENOSIS  The various methods of treatment have been discussed with the patient and family. After consideration of risks, benefits and other options for treatment, the patient has consented to  Procedure(s): ENDARTERECTOMY CAROTID LEFT (Left) as a surgical intervention .  The patient's history has been reviewed, patient examined, no change in status, stable for surgery.  I have reviewed the patient's chart and labs.  Questions were answered to the patient's satisfaction.     Curt Jews

## 2017-08-03 NOTE — Transfer of Care (Signed)
Immediate Anesthesia Transfer of Care Note  Patient: Jenna Lowe  Procedure(s) Performed: ENDARTERECTOMY CAROTID LEFT (Left )  Patient Location: PACU  Anesthesia Type:General  Level of Consciousness: awake, alert  and oriented  Airway & Oxygen Therapy: Patient Spontanous Breathing and Patient connected to face mask oxygen  Post-op Assessment: Report given to RN and Post -op Vital signs reviewed and stable  Post vital signs: Reviewed and stable  Last Vitals:  Vitals:   08/03/17 1140 08/03/17 1145  BP:  (!) 79/45  Pulse: 94 97  Resp: (!) 26 16  Temp: (!) 36.1 C   SpO2: 95% 95%    Last Pain:  Vitals:   08/03/17 0839  TempSrc: Oral      Patients Stated Pain Goal: 3 (01/74/94 4967)  Complications: No apparent anesthesia complications

## 2017-08-03 NOTE — Plan of Care (Signed)
  Activity: Risk for activity intolerance will decrease 08/03/2017 2357 - Progressing by Lajoyce Corners, RN  Patient getting up to bedside commode without difficulty.

## 2017-08-03 NOTE — Progress Notes (Signed)
Patient arrived on the unit from PACU, assessment completed see flowsheet, placed on tele ccmd notified, patient oriented to room and staff, bed in lowest position call bell in reach will continue to monitor.

## 2017-08-03 NOTE — Anesthesia Preprocedure Evaluation (Signed)
Anesthesia Evaluation  Patient identified by MRN, date of birth, ID band Patient awake    Reviewed: Allergy & Precautions, NPO status , Patient's Chart, lab work & pertinent test results  Airway Mallampati: II  TM Distance: >3 FB Neck ROM: Full    Dental  (+) Teeth Intact, Dental Advisory Given   Pulmonary Current Smoker,    breath sounds clear to auscultation       Cardiovascular hypertension,  Rhythm:Regular Rate:Normal     Neuro/Psych    GI/Hepatic   Endo/Other    Renal/GU      Musculoskeletal   Abdominal   Peds  Hematology   Anesthesia Other Findings   Reproductive/Obstetrics                             Anesthesia Physical Anesthesia Plan  ASA: III  Anesthesia Plan: General   Post-op Pain Management:    Induction: Intravenous  PONV Risk Score and Plan: Ondansetron and Dexamethasone  Airway Management Planned: Oral ETT  Additional Equipment: Arterial line  Intra-op Plan:   Post-operative Plan: Extubation in OR  Informed Consent: I have reviewed the patients History and Physical, chart, labs and discussed the procedure including the risks, benefits and alternatives for the proposed anesthesia with the patient or authorized representative who has indicated his/her understanding and acceptance.   Dental advisory given  Plan Discussed with: CRNA and Anesthesiologist  Anesthesia Plan Comments:         Anesthesia Quick Evaluation

## 2017-08-04 ENCOUNTER — Encounter (HOSPITAL_COMMUNITY): Payer: Self-pay | Admitting: Vascular Surgery

## 2017-08-04 LAB — CBC
HCT: 33.6 % — ABNORMAL LOW (ref 36.0–46.0)
Hemoglobin: 10.8 g/dL — ABNORMAL LOW (ref 12.0–15.0)
MCH: 30 pg (ref 26.0–34.0)
MCHC: 32.1 g/dL (ref 30.0–36.0)
MCV: 93.3 fL (ref 78.0–100.0)
PLATELETS: 202 10*3/uL (ref 150–400)
RBC: 3.6 MIL/uL — ABNORMAL LOW (ref 3.87–5.11)
RDW: 13.1 % (ref 11.5–15.5)
WBC: 11.6 10*3/uL — AB (ref 4.0–10.5)

## 2017-08-04 LAB — BASIC METABOLIC PANEL
Anion gap: 11 (ref 5–15)
BUN: 11 mg/dL (ref 6–20)
CALCIUM: 8.9 mg/dL (ref 8.9–10.3)
CO2: 22 mmol/L (ref 22–32)
CREATININE: 0.81 mg/dL (ref 0.44–1.00)
Chloride: 107 mmol/L (ref 101–111)
Glucose, Bld: 114 mg/dL — ABNORMAL HIGH (ref 65–99)
Potassium: 3.8 mmol/L (ref 3.5–5.1)
SODIUM: 140 mmol/L (ref 135–145)

## 2017-08-04 MED ORDER — OXYCODONE-ACETAMINOPHEN 5-325 MG PO TABS: 1.0000 | ORAL_TABLET | Freq: Four times a day (QID) | ORAL | 0 refills | Status: DC | PRN

## 2017-08-04 NOTE — Discharge Summary (Signed)
Discharge Summary     Jenna Lowe 1945/12/16 72 y.o. female  [id number]  Admission Date: 08/03/2017  Discharge Date: 08/04/17  Physician: No att. providers found  Admission Diagnosis: LEFT CAROTID STENOSIS  Discharge Day services:    see progress note 08/04/17 Physical Exam: Vitals:   08/04/17 0400 08/04/17 0500  BP: 117/63   Pulse:    Resp: 14 16  Temp:    SpO2: 90%     Hospital Course:  The patient was admitted to the hospital and taken to the operating room on 08/03/2017 and underwent left carotid endarterectomy.  The pt tolerated the procedure well and was transported to the PACU in good condition.   By POD 1, the pt neuro status remains at baseline.  The remainder of the hospital course consisted of increasing mobilization and increasing intake of solids without difficulty.  She will be prescribed 2 days of narcotic pain medication.  She will follow up in office in about 2 weeks.  Discharge instructions were reviewed with the patient and she voices her understanding.  She will be discharged this morning in stable condition.   Recent Labs    08/04/17 0407  NA 140  K 3.8  CL 107  CO2 22  GLUCOSE 114*  BUN 11  CALCIUM 8.9   Recent Labs    08/04/17 0407  WBC 11.6*  HGB 10.8*  HCT 33.6*  PLT 202   No results for input(s): INR in the last 72 hours.     Discharge Diagnosis:  LEFT CAROTID STENOSIS  Secondary Diagnosis: Patient Active Problem List   Diagnosis Date Noted  . Carotid artery stenosis 08/03/2017  . Malignant neoplasm of upper-inner quadrant of left breast in female, estrogen receptor positive ([place]) 07/17/2016   Past Medical History:  Diagnosis Date  . Breast cancer of upper-outer quadrant of left female breast ([place])   . H/O seasonal allergies   . History of kidney stones   . Hyperlipidemia   . Hypertension     Allergies as of 08/04/2017      Reactions   Pseudoephedrine Other (See Comments)   Insomnia   Sulfa Antibiotics  Itching   Arimidex [anastrozole] Other (See Comments)   Night Sweats   Amoxicillin-pot Clavulanate Diarrhea      Medication List    TAKE these medications   anastrozole 1 MG tablet Commonly known as:  ARIMIDEX TAKE 1 TABLET BY MOUTH  DAILY   aspirin 81 MG tablet Take 81 mg by mouth daily.   cetirizine 10 MG tablet Commonly known as:  ZYRTEC ALLERGY Take 1 tablet (10 mg total) by mouth daily.   fluticasone 50 MCG/ACT nasal spray Commonly known as:  FLONASE Place into both nostrils daily.   hydrochlorothiazide 12.5 MG capsule Commonly known as:  MICROZIDE Take 1 capsule (12.5 mg total) by mouth daily.   ibuprofen 200 MG tablet Commonly known as:  ADVIL,MOTRIN Take 200 mg by mouth every 8 (eight) hours as needed for mild pain.   lisinopril 20 MG tablet Commonly known as:  PRINIVIL,ZESTRIL Take 20 mg by mouth daily.   MINICAPS VITAMIN-D/OMEGA-3 PO Take 1 capsule by mouth daily.   multivitamin with minerals tablet Take 1 tablet by mouth daily.   oxyCODONE-acetaminophen 5-325 MG tablet Commonly known as:  PERCOCET/ROXICET Take 1-2 tablets by mouth every 6 (six) hours as needed for moderate pain.        Discharge Instructions:   [facility] Discharge Instructions Carotid Endarterectomy (CEA)  Please refer to the following instructions for your post-procedure care. Your surgeon or physician assistant will discuss any changes with you.  Activity  You are encouraged to walk as much as you can. You can slowly return to normal activities but must avoid strenuous activity and heavy lifting until your doctor tell you it's OK. Avoid activities such as vacuuming or swinging a golf club. You can drive after one week if you are comfortable and you are no longer taking prescription pain medications. It is normal to feel tired for serval weeks after your surgery. It is also normal to have difficulty with sleep habits, eating, and bowel movements  after surgery. These will go away with time.  Bathing/Showering  You may shower after you come home. Do not soak in a bathtub, hot tub, or swim until the incision heals completely.  Incision Care  Shower every day. Clean your incision with mild soap and water. Pat the area dry with a clean towel. You do not need a bandage unless otherwise instructed. Do not apply any ointments or creams to your incision. You may have skin glue on your incision. Do not peel it off. It will come off on its own in about one week. Your incision may feel thickened and raised for several weeks after your surgery. This is normal and the skin will soften over time. For Men Only: It's OK to shave around the incision but do not shave the incision itself for 2 weeks. It is common to have numbness under your chin that could last for several months.  Diet  Resume your normal diet. There are no special food restrictions following this procedure. A low fat/low cholesterol diet is recommended for all patients with vascular disease. In order to heal from your surgery, it is CRITICAL to get adequate nutrition. Your body requires vitamins, minerals, and protein. Vegetables are the best source of vitamins and minerals. Vegetables also provide the perfect balance of protein. Processed food has little nutritional value, so try to avoid this.  Medications  Resume taking all of your medications unless your doctor or physician assistant tells you not to.  If your incision is causing pain, you may take over-the- counter pain relievers such as acetaminophen (Tylenol). If you were prescribed a stronger pain medication, please be aware these medications can cause nausea and constipation.  Prevent nausea by taking the medication with a snack or meal. Avoid constipation by drinking plenty of fluids and eating foods with a high amount of fiber, such as fruits, vegetables, and grains. Do not take Tylenol if you are taking prescription pain  medications.  Follow Up  Our office will schedule a follow up appointment 2-3 weeks following discharge.  Please call us immediately for any of the following conditions  Increased pain, redness, drainage (pus) from your incision site. Fever of 101 degrees or higher. If you should develop stroke (slurred speech, difficulty swallowing, weakness on one side of your body, loss of vision) you should call 911 and go to the nearest emergency room.  Reduce your risk of vascular disease:  Stop smoking. If you would like help call [facility] at 1-800-QUIT-NOW 539-718-1813) or [place] at [phone]. Manage your cholesterol Maintain a desired weight Control your diabetes Keep your blood pressure down  If you have any questions, please call the office at [phone].  Disposition: home  Patient's condition: is Good  Follow up: 1. Dr. Donnetta Hutching in 2 weeks.   Dagoberto Ligas, PA-C [facility] [phone]   ---  For [facility] Registry use ---   Modified Rankin score at D/C (0-6): 0  IV medication needed for:  1. Hypertension: No 2. Hypotension: No  Post-op Complications: No  1. Post-op CVA or TIA: No  If yes: Event classification (right eye, left eye, right cortical, left cortical, verterobasilar, other):   If yes: Timing of event (intra-op, <6 hrs post-op, >=6 hrs post-op, unknown):   2. CN injury: No  If yes: CN  injuried   3. Myocardial infarction: No  If yes: Dx by (EKG or clinical, Troponin):   4.  CHF: No  5.  Dysrhythmia (new): No  6. Wound infection: No  7. Reperfusion symptoms: No  8. Return to OR: No  If yes: return to OR for (bleeding, neurologic, other CEA incision, other):   Discharge medications: Statin use:  No ASA use:  Yes   Beta blocker use:  No ACE-Inhibitor use:  Yes  ARB use:  No CCB use: No P2Y12 Antagonist use: No, [ ]  Plavix, [ ]  Plasugrel, [ ]  Ticlopinine, [ ]  Ticagrelor, [ ]  Other, [ ]  No for medical reason, [ ]   Non-compliant, [ ]  Not-indicated Anti-coagulant use:  No, [ ]  Warfarin, [ ]  Rivaroxaban, [ ]  Dabigatran,

## 2017-08-04 NOTE — Discharge Instructions (Signed)
   [facility] [place]  Discharge Instructions   Carotid Endarterectomy (CEA)  Please refer to the following instructions for your post-procedure care. Your surgeon or physician assistant will discuss any changes with you.  Activity  You are encouraged to walk as much as you can. You can slowly return to normal activities but must avoid strenuous activity and heavy lifting until your doctor tell you it's OK. Avoid activities such as vacuuming or swinging a golf club. You can drive after one week if you are comfortable and you are no longer taking prescription pain medications. It is normal to feel tired for serval weeks after your surgery. It is also normal to have difficulty with sleep habits, eating, and bowel movements after surgery. These will go away with time.  Bathing/Showering  You may shower after you come home. Do not soak in a bathtub, hot tub, or swim until the incision heals completely.  Incision Care  Shower every day. Clean your incision with mild soap and water. Pat the area dry with a clean towel. You do not need a bandage unless otherwise instructed. Do not apply any ointments or creams to your incision. You may have skin glue on your incision. Do not peel it off. It will come off on its own in about one week. Your incision may feel thickened and raised for several weeks after your surgery. This is normal and the skin will soften over time. For Men Only: It's OK to shave around the incision but do not shave the incision itself for 2 weeks. It is common to have numbness under your chin that could last for several months.  Diet  Resume your normal diet. There are no special food restrictions following this procedure. A low fat/low cholesterol diet is recommended for all patients with vascular disease. In order to heal from your surgery, it is CRITICAL to get adequate nutrition. Your body requires vitamins, minerals, and protein. Vegetables are the best  source of vitamins and minerals. Vegetables also provide the perfect balance of protein. Processed food has little nutritional value, so try to avoid this.        Medications  Resume taking all of your medications unless your doctor or physician assistant tells you not to. If your incision is causing pain, you may take over-the- counter pain relievers such as acetaminophen (Tylenol). If you were prescribed a stronger pain medication, please be aware these medications can cause nausea and constipation. Prevent nausea by taking the medication with a snack or meal. Avoid constipation by drinking plenty of fluids and eating foods with a high amount of fiber, such as fruits, vegetables, and grains. Do not take Tylenol if you are taking prescription pain medications.  Follow Up  Our office will schedule a follow up appointment 2-3 weeks following discharge.  Please call us immediately for any of the following conditions  Increased pain, redness, drainage (pus) from your incision site. Fever of 101 degrees or higher. If you should develop stroke (slurred speech, difficulty swallowing, weakness on one side of your body, loss of vision) you should call 911 and go to the nearest emergency room.  Reduce your risk of vascular disease:  Stop smoking. If you would like help call [facility] at 1-800-QUIT-NOW (1-800-784-8669) or [place] at [phone]. Manage your cholesterol Maintain a desired weight Control your diabetes Keep your blood pressure down  If you have any questions, please call the office at [phone].   

## 2017-08-04 NOTE — Progress Notes (Addendum)
  Progress Note    08/04/2017 7:35 AM 1 Day Post-Op  Subjective:  Denies stroke like symptoms including slurring speech, changes in vision, or one sided weakness   Vitals:   08/04/17 0000 08/04/17 0400  BP: (!) 112/59 117/63  Pulse: 70   Resp: 15 14  Temp: 98 F (36.7 C)   SpO2: 91% 90%     Physical Exam: Neuro:  Baseline neuro; mild weakness L lower lip Lungs:  Non labored Incision:  L neck incision without hematoma, soft, trachea midline   CBC    Component Value Date/Time   WBC 11.6 (H) 08/04/2017 0407   RBC 3.60 (L) 08/04/2017 0407   HGB 10.8 (L) 08/04/2017 0407   HCT 33.6 (L) 08/04/2017 0407   PLT 202 08/04/2017 0407   MCV 93.3 08/04/2017 0407   MCH 30.0 08/04/2017 0407   MCHC 32.1 08/04/2017 0407   RDW 13.1 08/04/2017 0407    BMET    Component Value Date/Time   NA 140 08/04/2017 0407   K 3.8 08/04/2017 0407   CL 107 08/04/2017 0407   CO2 22 08/04/2017 0407   GLUCOSE 114 (H) 08/04/2017 0407   BUN 11 08/04/2017 0407   CREATININE 0.81 08/04/2017 0407   CALCIUM 8.9 08/04/2017 0407   GFRNONAA >60 08/04/2017 0407   GFRAA >60 08/04/2017 0407     Intake/Output Summary (Last 24 hours) at 08/04/2017 0735 Last data filed at 08/04/2017 0434 Gross per 24 hour  Intake 1096.25 ml  Output 930 ml  Net 166.25 ml     Assessment/Plan:  This is a 72 y.o. female who is s/p L CEA 1 Day Post-Op  -pt is doing well this am. -pt neuro exam is in tact -pt has ambulated -pt has voided -f/u with Dr. Donnetta Hutching in 2 weeks.   Dagoberto Ligas, PA-C [facility] [phone]

## 2017-08-13 ENCOUNTER — Telehealth: Payer: Self-pay | Admitting: Vascular Surgery

## 2017-08-13 NOTE — Telephone Encounter (Signed)
Sched 2-3 w f/u from L CEA on 08/03/17 for 08/25/17 at 9:45 with [facility]. Spoke to pt to inform them of appt.

## 2017-08-25 ENCOUNTER — Encounter: Payer: Self-pay | Admitting: Vascular Surgery

## 2017-08-25 ENCOUNTER — Ambulatory Visit (INDEPENDENT_AMBULATORY_CARE_PROVIDER_SITE_OTHER): Payer: Medicare Other | Admitting: Vascular Surgery

## 2017-08-25 ENCOUNTER — Other Ambulatory Visit: Payer: Self-pay

## 2017-08-25 VITALS — BP 113/66 | HR 96 | Temp 97.7°F | Resp 16 | Ht 63.0 in | Wt 120.5 lb

## 2017-08-25 DIAGNOSIS — I6522 Occlusion and stenosis of left carotid artery: Secondary | ICD-10-CM

## 2017-08-25 NOTE — Progress Notes (Signed)
   Patient name: Jenna Lowe MRN: [id number] DOB: Jun 04, 1945 Sex: female  REASON FOR VISIT: Follow-up left carotid endarterectomy  HPI: Jenna Lowe is a 73 y.o. female here today for follow-up from left carotid endarterectomy for severe asymptomatic disease 3 weeks ago.  She did well and was discharged home on postoperative day #1.  She has had no new neurologic deficits.  She does report the typical peri-incisional numbness and this is resolving.  Current Outpatient Medications  Medication Sig Dispense Refill  . aspirin 81 MG tablet Take 81 mg by mouth daily.    . cetirizine (ZYRTEC ALLERGY) 10 MG tablet Take 1 tablet (10 mg total) by mouth daily.    . fluticasone (FLONASE) 50 MCG/ACT nasal spray Place into both nostrils daily.    . hydrochlorothiazide (MICROZIDE) 12.5 MG capsule Take 1 capsule (12.5 mg total) by mouth daily.    Marland Kitchen ibuprofen (ADVIL,MOTRIN) 200 MG tablet Take 200 mg by mouth every 8 (eight) hours as needed for mild pain.    Marland Kitchen lisinopril (PRINIVIL,ZESTRIL) 20 MG tablet Take 20 mg by mouth daily.    . Multiple Vitamins-Minerals (MULTIVITAMIN WITH MINERALS) tablet Take 1 tablet by mouth daily.    . Omega-3 Fat Ac-Cholecalciferol (MINICAPS VITAMIN-D/OMEGA-3 PO) Take 1 capsule by mouth daily.     No current facility-administered medications for this visit.      PHYSICAL EXAM: Vitals:   08/25/17 0944 08/25/17 0946  BP: 112/73 113/66  Pulse: 96   Resp: 16   Temp: 97.7 F (36.5 C)   TempSrc: Oral   SpO2: 99%   Weight: 120 lb 8 oz (54.7 kg)   Height: 5\' 3"  (1.6 m)     GENERAL: The patient is a well-nourished female, in no acute distress. The vital signs are documented above. Left neck incision is well-healed with no bruits bilaterally.  Neurologically intact.  MEDICAL ISSUES: Stable status post left carotid endarterectomy for severe asymptomatic carotid stenosis.  Will resume full activities.  We will see her again in 9 months  with carotid duplex follow-up.  She will notify should she have any logic deficits or wound issues   Rosetta Posner, MD FACS [facility] Tel [phone] Pager [phone]

## 2017-09-30 ENCOUNTER — Other Ambulatory Visit: Payer: Self-pay | Admitting: General Surgery

## 2017-09-30 DIAGNOSIS — Z853 Personal history of malignant neoplasm of breast: Secondary | ICD-10-CM

## 2017-10-01 ENCOUNTER — Other Ambulatory Visit: Payer: Self-pay

## 2017-10-01 ENCOUNTER — Other Ambulatory Visit: Payer: Self-pay | Admitting: General Surgery

## 2017-10-01 DIAGNOSIS — Z853 Personal history of malignant neoplasm of breast: Secondary | ICD-10-CM

## 2017-10-05 ENCOUNTER — Ambulatory Visit
Admission: RE | Admit: 2017-10-05 | Discharge: 2017-10-05 | Disposition: A | Discharge status: Home / Self Care | Payer: Medicare Other | Source: Ambulatory Visit | Attending: General Surgery | Admitting: General Surgery

## 2017-10-05 DIAGNOSIS — Z853 Personal history of malignant neoplasm of breast: Secondary | ICD-10-CM

## 2017-10-05 HISTORY — DX: Personal history of irradiation: Z92.3

## 2018-05-14 ENCOUNTER — Other Ambulatory Visit: Payer: Self-pay

## 2018-05-14 DIAGNOSIS — I6522 Occlusion and stenosis of left carotid artery: Secondary | ICD-10-CM

## 2018-06-08 ENCOUNTER — Ambulatory Visit (HOSPITAL_COMMUNITY)
Admission: RE | Admit: 2018-06-08 | Discharge: 2018-06-08 | Disposition: A | Discharge status: Home / Self Care | Payer: Medicare Other | Source: Ambulatory Visit | Attending: Internal Medicine | Admitting: Internal Medicine

## 2018-06-08 ENCOUNTER — Other Ambulatory Visit: Payer: Self-pay

## 2018-06-08 ENCOUNTER — Ambulatory Visit (INDEPENDENT_AMBULATORY_CARE_PROVIDER_SITE_OTHER): Payer: Medicare Other | Admitting: Vascular Surgery

## 2018-06-08 ENCOUNTER — Encounter: Payer: Self-pay | Admitting: Vascular Surgery

## 2018-06-08 VITALS — BP 120/76 | HR 81 | Temp 97.9°F | Ht 63.0 in | Wt 120.0 lb

## 2018-06-08 DIAGNOSIS — I6522 Occlusion and stenosis of left carotid artery: Secondary | ICD-10-CM | POA: Diagnosis present

## 2018-06-08 NOTE — Progress Notes (Signed)
[facility]  Patient name: Jenna Lowe MRN: [id number] DOB: 1945-12-21 Sex: female  REASON FOR VISIT: Follow-up carotid surgery  HPI: Jenna Lowe is a 73 y.o. female here today for follow-up.  She underwent left carotid endarterectomy for severe asymptomatic carotid disease in March 2019.  She did well with an uneventful recovery.  She is here today for follow-up.  She denies any neurologic deficits.  She is right-handed.  She does report some persistent peri-incisional numbness and tingling which are not a bother to her.  Past Medical History:  Diagnosis Date  . Breast cancer of upper-outer quadrant of left female breast ([place])   . H/O seasonal allergies   . History of kidney stones   . Hyperlipidemia   . Hypertension   . Personal history of radiation therapy     Family History  Problem Relation Age of Onset  . Lung cancer Sister   . Heart disease Sister   . Hypertension Sister   . Cancer Sister   . Stroke Sister   . COPD Sister   . Kidney disease Mother   . Heart disease Father     SOCIAL HISTORY: Social History   Tobacco Use  . Smoking status: Current Every Day Smoker    Packs/day: 1.00    Years: 30.00    Pack years: 30.00    Types: Cigarettes  . Smokeless tobacco: Never Used  Substance Use Topics  . Alcohol use: No    Allergies  Allergen Reactions  . Pseudoephedrine Other (See Comments)    Insomnia  . Sulfa Antibiotics Itching  . Arimidex [Anastrozole] Other (See Comments)    Night Sweats  . Amoxicillin-Pot Clavulanate Diarrhea    Current Outpatient Medications  Medication Sig Dispense Refill  . aspirin 81 MG tablet Take 81 mg by mouth daily.    . cetirizine (ZYRTEC ALLERGY) 10 MG tablet Take 1 tablet (10 mg total) by mouth daily.    . fluticasone (FLONASE) 50 MCG/ACT nasal spray Place into both nostrils daily.    . hydrochlorothiazide (MICROZIDE) 12.5 MG capsule Take 1  capsule (12.5 mg total) by mouth daily.    Marland Kitchen ibuprofen (ADVIL,MOTRIN) 200 MG tablet Take 200 mg by mouth every 8 (eight) hours as needed for mild pain.    Marland Kitchen lisinopril (PRINIVIL,ZESTRIL) 20 MG tablet Take 20 mg by mouth daily.    . Multiple Vitamins-Minerals (MULTIVITAMIN WITH MINERALS) tablet Take 1 tablet by mouth daily.    . Omega-3 Fat Ac-Cholecalciferol (MINICAPS VITAMIN-D/OMEGA-3 PO) Take 1 capsule by mouth daily.     No current facility-administered medications for this visit.     REVIEW OF SYSTEMS:  [X]  denotes positive finding, [ ]  denotes negative finding Cardiac  Comments:  Chest pain or chest pressure:    Shortness of breath upon exertion:    Short of breath when lying flat:    Irregular heart rhythm:        Vascular    Pain in calf, thigh, or hip brought on by ambulation:    Pain in feet at night that wakes you up from your sleep:     Blood clot in your veins:    Leg swelling:           PHYSICAL EXAM: Vitals:   06/08/18 1546 06/08/18 1550  BP: 123/86 120/76  Pulse: 81   Temp: 97.9 F (36.6 C)   Weight: 120 lb (54.4 kg)   Height: 5\' 3"  (1.6 m)  GENERAL: The patient is a well-nourished female, in no acute distress. The vital signs are documented above. CARDIOVASCULAR: Left neck incision well-healed with no bruit in her right or left neck.  2+ radial pulses PULMONARY: There is good air exchange  MUSCULOSKELETAL: There are no major deformities or cyanosis. NEUROLOGIC: No focal weakness or paresthesias are detected. SKIN: There are no ulcers or rashes noted. PSYCHIATRIC: The patient has a normal affect.  DATA:  Carotid duplex today reveals no evidence of right carotid stenosis.  She does have slightly elevated velocities in her left endarterectomy.  She is at the lower end of the 40 to 59% narrowing.  MEDICAL ISSUES: Stable status post left carotid endarterectomy.  She will continue full activity.  She will present immediately to emergency room should she  develop any neurologic deficits.  We will see her again in 1 year with carotid duplex follow-up    Rosetta Posner, MD [facility] Tel [phone] Pager [phone]

## 2018-09-09 ENCOUNTER — Other Ambulatory Visit: Payer: Self-pay | Admitting: General Surgery

## 2018-09-09 DIAGNOSIS — Z853 Personal history of malignant neoplasm of breast: Secondary | ICD-10-CM

## 2018-11-05 ENCOUNTER — Other Ambulatory Visit: Payer: Self-pay

## 2018-11-05 ENCOUNTER — Ambulatory Visit
Admission: RE | Admit: 2018-11-05 | Discharge: 2018-11-05 | Disposition: A | Discharge status: Home / Self Care | Payer: Medicare Other | Source: Ambulatory Visit | Attending: General Surgery | Admitting: General Surgery

## 2018-11-05 DIAGNOSIS — Z853 Personal history of malignant neoplasm of breast: Secondary | ICD-10-CM

## 2019-08-03 ENCOUNTER — Ambulatory Visit: Payer: Medicare Other

## 2019-08-03 ENCOUNTER — Encounter (HOSPITAL_COMMUNITY): Payer: Medicare Other

## 2019-08-04 ENCOUNTER — Telehealth (HOSPITAL_COMMUNITY): Payer: Self-pay

## 2019-08-04 NOTE — Telephone Encounter (Signed)

## 2019-08-08 ENCOUNTER — Other Ambulatory Visit: Payer: Self-pay

## 2019-08-08 ENCOUNTER — Ambulatory Visit (INDEPENDENT_AMBULATORY_CARE_PROVIDER_SITE_OTHER): Payer: Medicare Other | Admitting: Physician Assistant

## 2019-08-08 ENCOUNTER — Ambulatory Visit (HOSPITAL_COMMUNITY)
Admission: RE | Admit: 2019-08-08 | Discharge: 2019-08-08 | Disposition: A | Discharge status: Home / Self Care | Payer: Medicare Other | Source: Ambulatory Visit | Attending: Surgery | Admitting: Surgery

## 2019-08-08 VITALS — BP 135/79 | HR 80 | Temp 98.0°F | Resp 16 | Ht 63.0 in | Wt 122.7 lb

## 2019-08-08 DIAGNOSIS — I6522 Occlusion and stenosis of left carotid artery: Secondary | ICD-10-CM | POA: Diagnosis present

## 2019-08-08 NOTE — Progress Notes (Signed)
History of Present Illness:  Patient is a 74 y.o. year old female who presents for evaluation of carotid stenosis. S/P left CEA in 20019 for asymptomatic carotid stenosis by Dr. Donnetta Hutching.    She denise amaurosis, aphasia and weakness in extremities.    Medical history includes: Tobacco abuse and current smoker, HTN and hyperlipidemia.  She does not tolerate statin drugs.     Past Medical History:  Diagnosis Date  . Breast cancer of upper-outer quadrant of left female breast ([place])   . H/O seasonal allergies   . History of kidney stones   . Hyperlipidemia   . Hypertension   . Personal history of radiation therapy     Past Surgical History:  Procedure Laterality Date  . BREAST BIOPSY Right 2018   benign  . BREAST LUMPECTOMY Left 07/22/2016  . BREAST LUMPECTOMY WITH RADIOACTIVE SEED AND SENTINEL LYMPH NODE BIOPSY Left 07/22/2016   Procedure: BREAST LUMPECTOMY WITH RADIOACTIVE SEED AND SENTINEL LYMPH NODE BIOPSY;  Surgeon: Excell Seltzer, MD;  Location: [facility];  Service: General;  Laterality: Left;  . ENDARTERECTOMY Left 08/03/2017   Procedure: ENDARTERECTOMY CAROTID LEFT;  Surgeon: Rosetta Posner, MD;  Location: [facility] OR;  Service: Vascular;  Laterality: Left;  . NO PAST SURGERIES       Social History Social History   Tobacco Use  . Smoking status: Current Every Day Smoker    Packs/day: 1.00    Years: 30.00    Pack years: 30.00    Types: Cigarettes  . Smokeless tobacco: Never Used  Substance Use Topics  . Alcohol use: No  . Drug use: No    Family History Family History  Problem Relation Age of Onset  . Lung cancer Sister   . Heart disease Sister   . Hypertension Sister   . Cancer Sister   . Stroke Sister   . COPD Sister   . Kidney disease Mother   . Heart disease Father     Allergies  Allergies  Allergen Reactions  . Pseudoephedrine Other (See Comments)    Insomnia  . Sulfa Antibiotics Itching  . Arimidex [Anastrozole] Other (See  Comments)    Night Sweats  . Amoxicillin-Pot Clavulanate Diarrhea     Current Outpatient Medications  Medication Sig Dispense Refill  . aspirin 81 MG tablet Take 81 mg by mouth daily.    . cetirizine (ZYRTEC ALLERGY) 10 MG tablet Take 1 tablet (10 mg total) by mouth daily.    . fluticasone (FLONASE) 50 MCG/ACT nasal spray Place into both nostrils daily.    . hydrochlorothiazide (MICROZIDE) 12.5 MG capsule Take 1 capsule (12.5 mg total) by mouth daily.    Marland Kitchen ibuprofen (ADVIL,MOTRIN) 200 MG tablet Take 200 mg by mouth every 8 (eight) hours as needed for mild pain.    Marland Kitchen lisinopril (PRINIVIL,ZESTRIL) 20 MG tablet Take 20 mg by mouth daily.    . Multiple Vitamins-Minerals (MULTIVITAMIN WITH MINERALS) tablet Take 1 tablet by mouth daily.    . Omega-3 Fat Ac-Cholecalciferol (MINICAPS VITAMIN-D/OMEGA-3 PO) Take 1 capsule by mouth daily.     No current facility-administered medications for this visit.    ROS:   General:  No weight loss, Fever, chills  HEENT: No recent headaches, no nasal bleeding, no visual changes, no sore throat  Neurologic: No dizziness, blackouts, seizures. No recent symptoms of stroke or mini- stroke. No recent episodes of slurred speech, or temporary blindness.  Cardiac: No recent episodes of chest pain/pressure, no shortness  of breath at rest.  No shortness of breath with exertion.  Denies history of atrial fibrillation or irregular heartbeat  Vascular: No history of rest pain in feet.  No history of claudication.  No history of non-healing ulcer, No history of DVT   Pulmonary: No home oxygen, no productive cough, no hemoptysis,  No asthma or wheezing  Musculoskeletal:  [ ]  Arthritis, [ ]  Low back pain,  [x ] Joint pain  Hematologic:No history of hypercoagulable state.  No history of easy bleeding.  No history of anemia  Gastrointestinal: No hematochezia or melena,  No gastroesophageal reflux, no trouble swallowing  Urinary: [ ]  chronic Kidney disease, [ ]  on HD  - [ ]  MWF or [ ]  TTHS, [ ]  Burning with urination, [ ]  Frequent urination, [ ]  Difficulty urinating;   Skin: No rashes  Psychological: No history of anxiety,  No history of depression   Physical Examination  Vitals:   08/08/19 1300  BP: 135/79  Pulse: 80  Resp: 16  Temp: 98 F (36.7 C)  TempSrc: Oral  SpO2: 95%  Weight: 122 lb 11.2 oz (55.7 kg)  Height: 5\' 3"  (1.6 m)    Body mass index is 21.74 kg/m.  General:  Alert and oriented, no acute distress HEENT: Normal Neck: No bruit or JVD Pulmonary: Clear to auscultation bilaterally Cardiac: Regular Rate and Rhythm without murmur Gastrointestinal: Soft, non-tender, non-distended, no mass, no scars Skin: No rash Extremity Pulses:  2+ radial, brachial, femoral, dorsalis pedis, posterior tibial pulses bilaterally Musculoskeletal: No deformity or edema  Neurologic: Upper and lower extremity motor 5/5 and symmetric  DATA:    Right Carotid Findings:  +----------+--------+--------+--------+------------------+--------+       PSV cm/sEDV cm/sStenosisPlaque DescriptionComments  +----------+--------+--------+--------+------------------+--------+  CCA Prox 98   12                      +----------+--------+--------+--------+------------------+--------+  CCA Mid  82   15                      +----------+--------+--------+--------+------------------+--------+  CCA Distal52   15       heterogenous         +----------+--------+--------+--------+------------------+--------+  ICA Prox 77   17   1-39%  heterogenous         +----------+--------+--------+--------+------------------+--------+  ICA Mid  91   23                      +----------+--------+--------+--------+------------------+--------+  ICA Distal108   27                        +----------+--------+--------+--------+------------------+--------+  ECA    96   8                       +----------+--------+--------+--------+------------------+--------+   +----------+--------+-------+----------------+-------------------+       PSV cm/sEDV cmsDescribe    Arm Pressure (mmHG)  +----------+--------+-------+----------------+-------------------+  [id number]   1   Multiphasic, WNL            +----------+--------+-------+----------------+-------------------+   +---------+--------+--+--------+--+---------+  VertebralPSV cm/s73EDV cm/s14Antegrade  +---------+--------+--+--------+--+---------+      Left Carotid Findings:  +----------+--------+--------+--------+------------------+--------+       PSV cm/sEDV cm/sStenosisPlaque DescriptionComments  +----------+--------+--------+--------+------------------+--------+  CCA Prox 81   16                      +----------+--------+--------+--------+------------------+--------+  CCA Mid  139  27       homogeneous          +----------+--------+--------+--------+------------------+--------+  CCA Distal141   28       homogeneous          +----------+--------+--------+--------+------------------+--------+  ICA Prox 136   29                      +----------+--------+--------+--------+------------------+--------+  ICA Mid  92   26                      +----------+--------+--------+--------+------------------+--------+  ICA Distal82   22                      +----------+--------+--------+--------+------------------+--------+  ECA    134   14                      +----------+--------+--------+--------+------------------+--------+    +----------+--------+--------+----------------+-------------------+       PSV cm/sEDV cm/sDescribe    Arm Pressure (mmHG)  +----------+--------+--------+----------------+-------------------+  [id number]   5    Multiphasic, WNL            +----------+--------+--------+----------------+-------------------+   +---------+--------+--+--------+-+----------+  VertebralPSV cm/s34EDV cm/s0Retrograde  +---------+--------+--+--------+-+----------+         Summary:  Right Carotid: Velocities in the right ICA are consistent with a 1-39%  stenosis.   Left Carotid: Patent carotid endarterectomy site with no evidence or  restenosis.   Vertebrals: Right vertebral artery demonstrates antegrade flow. Left  vertebral        artery demonstrates retrograde flow.  Subclavians: Normal flow hemodynamics were seen in bilateral subclavian        arteries.   ASSESSMENT:  Asymptomatic carotid stenosis s/p left CEA 08/03/2017 by Dr. Donnetta Hutching.  Patent left ICA without stenosis.  Right ICA < 39% stenosis.    PLAN: We reviewed signs and symptoms of stroke.  If these occur she will call 911.  Otherwise she will f/u in 1 year for repeat carotid duplex.     Roxy Horseman PA-C [facility] Office: [phone]  MD in clinic [place]

## 2019-08-09 ENCOUNTER — Other Ambulatory Visit: Payer: Self-pay | Admitting: *Deleted

## 2019-08-09 DIAGNOSIS — I6522 Occlusion and stenosis of left carotid artery: Secondary | ICD-10-CM

## 2019-10-06 ENCOUNTER — Other Ambulatory Visit: Payer: Self-pay | Admitting: Internal Medicine

## 2019-10-06 DIAGNOSIS — Z853 Personal history of malignant neoplasm of breast: Secondary | ICD-10-CM

## 2019-10-06 DIAGNOSIS — Z9889 Other specified postprocedural states: Secondary | ICD-10-CM

## 2019-11-07 ENCOUNTER — Other Ambulatory Visit: Payer: Self-pay

## 2019-11-07 ENCOUNTER — Ambulatory Visit
Admission: RE | Admit: 2019-11-07 | Discharge: 2019-11-07 | Disposition: A | Discharge status: Home / Self Care | Payer: Medicare Other | Source: Ambulatory Visit | Attending: Internal Medicine | Admitting: Internal Medicine

## 2019-11-07 DIAGNOSIS — Z853 Personal history of malignant neoplasm of breast: Secondary | ICD-10-CM

## 2019-11-07 DIAGNOSIS — Z9889 Other specified postprocedural states: Secondary | ICD-10-CM

## 2020-08-08 ENCOUNTER — Ambulatory Visit (HOSPITAL_COMMUNITY)
Admission: RE | Admit: 2020-08-08 | Discharge: 2020-08-08 | Disposition: A | Discharge status: Home / Self Care | Payer: Medicare Other | Source: Ambulatory Visit | Attending: Vascular Surgery | Admitting: Vascular Surgery

## 2020-08-08 ENCOUNTER — Ambulatory Visit: Payer: Medicare Other | Admitting: Physician Assistant

## 2020-08-08 ENCOUNTER — Other Ambulatory Visit: Payer: Self-pay

## 2020-08-08 VITALS — BP 107/67 | HR 78 | Temp 98.4°F | Resp 20 | Ht 63.0 in | Wt 125.5 lb

## 2020-08-08 DIAGNOSIS — I6522 Occlusion and stenosis of left carotid artery: Secondary | ICD-10-CM

## 2020-08-08 NOTE — Progress Notes (Signed)
History of Present Illness:  Patient is a 75 y.o. year old female who presents for evaluation of carotid stenosis.   S/P left CEA in 20019 for asymptomatic carotid stenosis by Dr. Donnetta Hutching.    The patient denies symptoms of TIA, amaurosis, or stroke.    She continues to take asa daily.  Past Medical History:  Diagnosis Date  . Breast cancer ([place]) 2018   Left breast  . Breast cancer of upper-outer quadrant of left female breast ([place])   . H/O seasonal allergies   . History of kidney stones   . Hyperlipidemia   . Hypertension   . Personal history of radiation therapy     Past Surgical History:  Procedure Laterality Date  . BREAST BIOPSY Right 2018   benign  . BREAST LUMPECTOMY Left 07/22/2016  . BREAST LUMPECTOMY WITH RADIOACTIVE SEED AND SENTINEL LYMPH NODE BIOPSY Left 07/22/2016   Procedure: BREAST LUMPECTOMY WITH RADIOACTIVE SEED AND SENTINEL LYMPH NODE BIOPSY;  Surgeon: Excell Seltzer, MD;  Location: [facility];  Service: General;  Laterality: Left;  . ENDARTERECTOMY Left 08/03/2017   Procedure: ENDARTERECTOMY CAROTID LEFT;  Surgeon: Rosetta Posner, MD;  Location: [facility] OR;  Service: Vascular;  Laterality: Left;  . NO PAST SURGERIES       Social History Social History   Tobacco Use  . Smoking status: Current Every Day Smoker    Packs/day: 1.00    Years: 30.00    Pack years: 30.00    Types: Cigarettes  . Smokeless tobacco: Never Used  Vaping Use  . Vaping Use: Never used  Substance Use Topics  . Alcohol use: No  . Drug use: No    Family History Family History  Problem Relation Age of Onset  . Lung cancer Sister   . Heart disease Sister   . Hypertension Sister   . Cancer Sister   . Stroke Sister   . COPD Sister   . Kidney disease Mother   . Heart disease Father     Allergies  Allergies  Allergen Reactions  . Pseudoephedrine Other (See Comments)    Insomnia  . Sulfa Antibiotics Itching  . Arimidex [Anastrozole] Other (See Comments)     Night Sweats  . Amoxicillin-Pot Clavulanate Diarrhea     Current Outpatient Medications  Medication Sig Dispense Refill  . aspirin 81 MG tablet Take 81 mg by mouth daily.    . cetirizine (ZYRTEC ALLERGY) 10 MG tablet Take 1 tablet (10 mg total) by mouth daily.    . cholecalciferol (VITAMIN D) 25 MCG (1000 UNIT) tablet 1 capsule    . fluticasone (FLONASE) 50 MCG/ACT nasal spray Place into both nostrils daily.    . hydrochlorothiazide (MICROZIDE) 12.5 MG capsule Take 1 capsule (12.5 mg total) by mouth daily.    Marland Kitchen ibuprofen (ADVIL,MOTRIN) 200 MG tablet Take 200 mg by mouth every 8 (eight) hours as needed for mild pain.    Marland Kitchen lisinopril (PRINIVIL,ZESTRIL) 20 MG tablet Take 20 mg by mouth daily.    . Multiple Vitamins-Minerals (MULTIVITAMIN WITH MINERALS) tablet Take 1 tablet by mouth daily.    Marland Kitchen omeprazole (PRILOSEC OTC) 20 MG tablet 1 capsule     No current facility-administered medications for this visit.    ROS:   General:  No weight loss, Fever, chills  HEENT: No recent headaches, no nasal bleeding, no visual changes, no sore throat  Neurologic: No dizziness, blackouts, seizures. No recent symptoms of stroke or mini- stroke. No recent episodes  of slurred speech, or temporary blindness.  Cardiac: No recent episodes of chest pain/pressure, no shortness of breath at rest.  No shortness of breath with exertion.  Denies history of atrial fibrillation or irregular heartbeat  Vascular: No history of rest pain in feet.  No history of claudication.  No history of non-healing ulcer, No history of DVT   Pulmonary: No home oxygen, no productive cough, no hemoptysis,  No asthma or wheezing  Musculoskeletal:  [ ]  Arthritis, [ ]  Low back pain,  [ ]  Joint pain  Hematologic:No history of hypercoagulable state.  No history of easy bleeding.  No history of anemia  Gastrointestinal: No hematochezia or melena,  No gastroesophageal reflux, no trouble swallowing  Urinary: [ ]  chronic Kidney disease,  [ ]  on HD - [ ]  MWF or [ ]  TTHS, [ ]  Burning with urination, [ ]  Frequent urination, [ ]  Difficulty urinating;   Skin: No rashes  Psychological: No history of anxiety,  No history of depression   Physical Examination  Vitals:   08/08/20 1318 08/08/20 1319  BP: 120/76 107/67  Pulse: 78   Resp: 20   Temp: 98.4 F (36.9 C)   TempSrc: Temporal   SpO2: 98%   Weight: 125 lb 8 oz (56.9 kg)   Height: 5\' 3"  (1.6 m)     Body mass index is 22.23 kg/m.  General:  Alert and oriented, no acute distress HEENT: Normal Neck: No bruit or JVD Pulmonary: Clear to auscultation bilaterally Cardiac: Regular Rate and Rhythm without murmur Gastrointestinal: Soft, non-tender, non-distended, no mass, no scars Skin: No rash Extremity Pulses:  2+ radial Musculoskeletal: No deformity or edema  Neurologic: Upper and lower extremity motor 5/5 and symmetric  DATA:    Right Carotid Findings:  +----------+--------+--------+--------+------------------+--------+       PSV cm/sEDV cm/sStenosisPlaque DescriptionComments  +----------+--------+--------+--------+------------------+--------+  CCA Prox 85   16                      +----------+--------+--------+--------+------------------+--------+  CCA Distal62   16                      +----------+--------+--------+--------+------------------+--------+  ICA Prox 73   21   1-39%                 +----------+--------+--------+--------+------------------+--------+  ICA Mid  90   30                      +----------+--------+--------+--------+------------------+--------+  ICA Distal77   24                      +----------+--------+--------+--------+------------------+--------+  ECA    81   12                       +----------+--------+--------+--------+------------------+--------+   +----------+--------+-------+----------------+-------------------+       PSV cm/sEDV cmsDescribe    Arm Pressure (mmHG)  +----------+--------+-------+----------------+-------------------+  [id number]       Multiphasic, WNL            +----------+--------+-------+----------------+-------------------+   +---------+--------+--+--------+--+---------+  VertebralPSV cm/s70EDV cm/s16Antegrade  +---------+--------+--+--------+--+---------+      Left Carotid Findings:  +----------+--------+--------+--------+------------------+--------+       PSV cm/sEDV cm/sStenosisPlaque DescriptionComments  +----------+--------+--------+--------+------------------+--------+  CCA Prox 79   18                      +----------+--------+--------+--------+------------------+--------+  CCA Distal246   83  60-79%   +----------+--------+--------+--------+------------------+--------+  ICA Prox 122   26   1-39%                 +----------+--------+--------+--------+------------------+--------+  ICA Mid  100   36                      +----------+--------+--------+--------+------------------+--------+  ICA Distal94   37                      +----------+--------+--------+--------+------------------+--------+  ECA    202   30                      +----------+--------+--------+--------+------------------+--------+   +----------+--------+--------+----------------+-------------------+       PSV cm/sEDV cm/sDescribe    Arm Pressure (mmHG)  +----------+--------+--------+----------------+-------------------+  [id number]       Multiphasic, WNL             +----------+--------+--------+----------------+-------------------+   +---------+--------+--------+------+  VertebralPSV cm/sEDV cm/sAbsent  +---------+--------+--------+------+    Summary:  Right Carotid: Velocities in the right ICA are consistent with a 1-39%  stenosis.   Left Carotid: Velocities in the left ICA are consistent with a 1-39%  stenosis.        60-79% stenosis at proximal patch site.   Vertebrals: Right vertebral artery demonstrates antegrade flow. Left  vertebral       artery demonstrates no discernable flow.   ASSESSMENT:  Asymptomatic carotid stenosis s/p left CEA by Dr. Donnetta Hutching 07/2017 The duplex shows proximal narrowing at proximal patch site of 60-79% Right ICA < 39%   PLAN:We reviewed signs and symptoms of stroke/TIA.  If she has these she will call 911.  I will schedule her for repeat carotid duplex in 6 months.  If she has > 80 % narrowing she will need further intervention.  I advised her to try and quit smoking and continue to take a daily ASA.   Roxy Horseman PA-C [facility] Office: [phone]  MD in clinic [place] Pager: [phone]

## 2020-08-09 ENCOUNTER — Other Ambulatory Visit: Payer: Self-pay

## 2020-08-09 DIAGNOSIS — I6522 Occlusion and stenosis of left carotid artery: Secondary | ICD-10-CM

## 2020-09-03 ENCOUNTER — Other Ambulatory Visit: Payer: Self-pay | Admitting: Adult Health

## 2020-09-03 ENCOUNTER — Other Ambulatory Visit: Payer: Self-pay

## 2020-09-03 ENCOUNTER — Ambulatory Visit
Admission: RE | Admit: 2020-09-03 | Discharge: 2020-09-03 | Disposition: A | Discharge status: Home / Self Care | Payer: Medicare Other | Source: Ambulatory Visit | Attending: Adult Health | Admitting: Adult Health

## 2020-09-03 DIAGNOSIS — R109 Unspecified abdominal pain: Secondary | ICD-10-CM

## 2020-09-03 DIAGNOSIS — R3129 Other microscopic hematuria: Secondary | ICD-10-CM

## 2020-10-15 ENCOUNTER — Other Ambulatory Visit: Payer: Self-pay | Admitting: Internal Medicine

## 2020-10-15 DIAGNOSIS — Z1231 Encounter for screening mammogram for malignant neoplasm of breast: Secondary | ICD-10-CM

## 2020-12-13 ENCOUNTER — Ambulatory Visit
Admission: RE | Admit: 2020-12-13 | Discharge: 2020-12-13 | Disposition: A | Discharge status: Home / Self Care | Payer: Medicare Other | Source: Ambulatory Visit | Attending: Internal Medicine | Admitting: Internal Medicine

## 2020-12-13 ENCOUNTER — Other Ambulatory Visit: Payer: Self-pay

## 2020-12-13 DIAGNOSIS — Z1231 Encounter for screening mammogram for malignant neoplasm of breast: Secondary | ICD-10-CM

## 2021-01-02 ENCOUNTER — Encounter (HOSPITAL_BASED_OUTPATIENT_CLINIC_OR_DEPARTMENT_OTHER): Payer: Self-pay | Admitting: Obstetrics and Gynecology

## 2021-01-02 ENCOUNTER — Other Ambulatory Visit: Payer: Self-pay

## 2021-01-02 ENCOUNTER — Emergency Department (HOSPITAL_BASED_OUTPATIENT_CLINIC_OR_DEPARTMENT_OTHER)
Admission: EM | Admit: 2021-01-02 | Discharge: 2021-01-03 | Disposition: A | Discharge status: Home / Self Care | Payer: Medicare Other | Attending: Emergency Medicine | Admitting: Emergency Medicine

## 2021-01-02 DIAGNOSIS — R197 Diarrhea, unspecified: Secondary | ICD-10-CM | POA: Diagnosis not present

## 2021-01-02 DIAGNOSIS — Z20822 Contact with and (suspected) exposure to covid-19: Secondary | ICD-10-CM | POA: Diagnosis not present

## 2021-01-02 DIAGNOSIS — Z79899 Other long term (current) drug therapy: Secondary | ICD-10-CM | POA: Insufficient documentation

## 2021-01-02 DIAGNOSIS — I10 Essential (primary) hypertension: Secondary | ICD-10-CM | POA: Insufficient documentation

## 2021-01-02 DIAGNOSIS — Z7982 Long term (current) use of aspirin: Secondary | ICD-10-CM | POA: Diagnosis not present

## 2021-01-02 DIAGNOSIS — F17210 Nicotine dependence, cigarettes, uncomplicated: Secondary | ICD-10-CM | POA: Diagnosis not present

## 2021-01-02 DIAGNOSIS — Z853 Personal history of malignant neoplasm of breast: Secondary | ICD-10-CM | POA: Insufficient documentation

## 2021-01-02 LAB — CBC WITH DIFFERENTIAL/PLATELET
Abs Immature Granulocytes: 0.04 10*3/uL (ref 0.00–0.07)
Basophils Absolute: 0.1 10*3/uL (ref 0.0–0.1)
Basophils Relative: 1 %
Eosinophils Absolute: 0.1 10*3/uL (ref 0.0–0.5)
Eosinophils Relative: 1 %
HCT: 42.1 % (ref 36.0–46.0)
Hemoglobin: 13.9 g/dL (ref 12.0–15.0)
Immature Granulocytes: 0 %
Lymphocytes Relative: 23 %
Lymphs Abs: 2.4 10*3/uL (ref 0.7–4.0)
MCH: 30 pg (ref 26.0–34.0)
MCHC: 33 g/dL (ref 30.0–36.0)
MCV: 90.9 fL (ref 80.0–100.0)
Monocytes Absolute: 0.7 10*3/uL (ref 0.1–1.0)
Monocytes Relative: 7 %
Neutro Abs: 7.2 10*3/uL (ref 1.7–7.7)
Neutrophils Relative %: 68 %
Platelets: 356 10*3/uL (ref 150–400)
RBC: 4.63 MIL/uL (ref 3.87–5.11)
RDW: 12.9 % (ref 11.5–15.5)
WBC: 10.5 10*3/uL (ref 4.0–10.5)
nRBC: 0 % (ref 0.0–0.2)

## 2021-01-02 LAB — COMPREHENSIVE METABOLIC PANEL
ALT: 9 U/L (ref 0–44)
AST: 14 U/L — ABNORMAL LOW (ref 15–41)
Albumin: 4 g/dL (ref 3.5–5.0)
Alkaline Phosphatase: 96 U/L (ref 38–126)
Anion gap: 9 (ref 5–15)
BUN: 17 mg/dL (ref 8–23)
CO2: 27 mmol/L (ref 22–32)
Calcium: 9.2 mg/dL (ref 8.9–10.3)
Chloride: 102 mmol/L (ref 98–111)
Creatinine, Ser: 0.67 mg/dL (ref 0.44–1.00)
GFR, Estimated: >60 mL/min (ref >60)
Glucose, Bld: 113 mg/dL — ABNORMAL HIGH (ref 70–99)
Potassium: 3.6 mmol/L (ref 3.5–5.1)
Sodium: 138 mmol/L (ref 135–145)
Total Bilirubin: 0.2 mg/dL — ABNORMAL LOW (ref 0.3–1.2)
Total Protein: 7.2 g/dL (ref 6.5–8.1)

## 2021-01-02 LAB — RESP PANEL BY RT-PCR (FLU A&B, COVID) ARPGX2
Influenza A by PCR: NEGATIVE
Influenza B by PCR: NEGATIVE
SARS Coronavirus 2 by RT PCR: NEGATIVE

## 2021-01-02 MED ORDER — SODIUM CHLORIDE 0.9 % IV BOLUS
500.0000 mL | Freq: Once | INTRAVENOUS | Status: AC
  Administered 2021-01-02: 500 mL via INTRAVENOUS

## 2021-01-02 NOTE — ED Triage Notes (Signed)
Patient reports x2 weeks ago she had a sore throat, and Friday she felt sluggish and had four negative Covid tests. Patient reports a week ago Monday she told her PCP she had a sinus infection and was prescribed Augmentin, which she had diarrhea with. She said she called the doctor's office back and they told her to stop the Augmentin and take a probiotic and drink Gatorade. Patient reports she still feels like she has a sinus infection. Patient reports she feels "washed out"

## 2021-01-03 ENCOUNTER — Encounter (HOSPITAL_BASED_OUTPATIENT_CLINIC_OR_DEPARTMENT_OTHER): Payer: Self-pay | Admitting: Emergency Medicine

## 2021-01-03 NOTE — ED Provider Notes (Signed)
[facility] EMERGENCY DEPT Provider Note   CSN: [id number] Arrival date & time: 01/02/21  1813     History No chief complaint on file.   Jenna Lowe is a 75 y.o. female.  The history is provided by the patient.  Diarrhea Quality:  Watery Severity:  Moderate Onset quality:  Gradual Duration: this was a week and a half ago following four tabs of augmentin. Timing:  Sporadic Progression:  Resolved Relieved by:  Nothing Worsened by:  Nothing Ineffective treatments:  None tried Associated symptoms: no abdominal pain, no arthralgias, no chills, no recent cough, no diaphoresis, no fever, no headaches, no myalgias, no URI and no vomiting   Risk factors: no recent antibiotic use       Past Medical History:  Diagnosis Date   Breast cancer ([place]) 2018   Left breast   Breast cancer of upper-outer quadrant of left female breast ([place])    H/O seasonal allergies    History of kidney stones    Hyperlipidemia    Hypertension    Personal history of radiation therapy     Patient Active Problem List   Diagnosis Date Noted   Carotid artery stenosis 08/03/2017   Malignant neoplasm of upper-inner quadrant of left breast in female, estrogen receptor positive ([place]) 07/17/2016    Past Surgical History:  Procedure Laterality Date   BREAST BIOPSY Right 2018   benign   BREAST LUMPECTOMY Left 07/22/2016   BREAST LUMPECTOMY WITH RADIOACTIVE SEED AND SENTINEL LYMPH NODE BIOPSY Left 07/22/2016   Procedure: BREAST LUMPECTOMY WITH RADIOACTIVE SEED AND SENTINEL LYMPH NODE BIOPSY;  Surgeon: Excell Seltzer, MD;  Location: [facility];  Service: General;  Laterality: Left;   ENDARTERECTOMY Left 08/03/2017   Procedure: ENDARTERECTOMY CAROTID LEFT;  Surgeon: Rosetta Posner, MD;  Location: [facility] OR;  Service: Vascular;  Laterality: Left;   NO PAST SURGERIES       OB History   No obstetric history on file.     Family History  Problem Relation Age of Onset    Lung cancer Sister    Heart disease Sister    Hypertension Sister    Cancer Sister    Stroke Sister    COPD Sister    Kidney disease Mother    Heart disease Father     Social History   Tobacco Use   Smoking status: Every Day    Packs/day: 1.00    Years: 30.00    Pack years: 30.00    Types: Cigarettes   Smokeless tobacco: Never  Vaping Use   Vaping Use: Never used  Substance Use Topics   Alcohol use: No   Drug use: No    Home Medications Prior to Admission medications   Medication Sig Start Date End Date Taking? Authorizing Provider  aspirin 81 MG tablet Take 81 mg by mouth daily.    [provider]  cetirizine (ZYRTEC ALLERGY) 10 MG tablet Take 1 tablet (10 mg total) by mouth daily. 01/21/17   Nicholas Lose, MD  cholecalciferol (VITAMIN D) 25 MCG (1000 UNIT) tablet 1 capsule    [provider]  fluticasone (FLONASE) 50 MCG/ACT nasal spray Place into both nostrils daily.    [provider]  hydrochlorothiazide (MICROZIDE) 12.5 MG capsule Take 1 capsule (12.5 mg total) by mouth daily. 08/11/16   Nicholas Lose, MD  ibuprofen (ADVIL,MOTRIN) 200 MG tablet Take 200 mg by mouth every 8 (eight) hours as needed for mild pain.    [provider]  lisinopril (PRINIVIL,ZESTRIL) 20 MG tablet Take 20 mg by mouth daily.    [provider]  Multiple Vitamins-Minerals (MULTIVITAMIN WITH MINERALS) tablet Take 1 tablet by mouth daily.    [provider]  omeprazole (PRILOSEC OTC) 20 MG tablet 1 capsule    [provider]    Allergies    Pseudoephedrine, Sulfa antibiotics, Arimidex [anastrozole], and Amoxicillin-pot clavulanate  Review of Systems   Review of Systems  Constitutional:  Negative for chills, diaphoresis and fever.  HENT:  Negative for facial swelling.   Eyes:  Negative for redness.  Respiratory:  Negative for cough and shortness of breath.   Cardiovascular:  Negative for chest pain, palpitations and leg swelling.   Gastrointestinal:  Positive for diarrhea. Negative for abdominal pain and vomiting.  Genitourinary:  Negative for difficulty urinating.  Musculoskeletal:  Negative for arthralgias, myalgias, neck pain and neck stiffness.  Skin:  Negative for rash.  Neurological:  Negative for headaches.  Psychiatric/Behavioral:  Negative for agitation.   All other systems reviewed and are negative.  Physical Exam Updated Vital Signs BP (!) 165/83   Pulse 84   Temp 98.2 F (36.8 C)   Resp 16   SpO2 97%   Physical Exam Vitals and nursing note reviewed.  Constitutional:      General: She is not in acute distress.    Appearance: Normal appearance.  HENT:     Head: Normocephalic and atraumatic.     Nose: Nose normal.  Eyes:     Conjunctiva/sclera: Conjunctivae normal.     Pupils: Pupils are equal, round, and reactive to light.  Cardiovascular:     Rate and Rhythm: Normal rate and regular rhythm.     Pulses: Normal pulses.     Heart sounds: Normal heart sounds.  Pulmonary:     Effort: Pulmonary effort is normal.     Breath sounds: Normal breath sounds.  Abdominal:     General: Abdomen is flat. Bowel sounds are normal.     Palpations: Abdomen is soft.     Tenderness: There is no abdominal tenderness. There is no guarding.  Musculoskeletal:        General: Normal range of motion.     Cervical back: Normal range of motion and neck supple.  Skin:    General: Skin is warm and dry.     Capillary Refill: Capillary refill takes less than 2 seconds.  Neurological:     General: No focal deficit present.     Mental Status: She is alert and oriented to person, place, and time.  Psychiatric:        Mood and Affect: Mood normal.        Behavior: Behavior normal.    ED Results / Procedures / Treatments   Labs (all labs ordered are listed, but only abnormal results are displayed) Results for orders placed or performed during the hospital encounter of 01/02/21  Resp Panel by RT-PCR (Flu A&B,  Covid) Nasopharyngeal Swab   Specimen: Nasopharyngeal Swab; Nasopharyngeal(NP) swabs in vial transport medium  Result Value Ref Range   SARS Coronavirus 2 by RT PCR NEGATIVE NEGATIVE   Influenza A by PCR NEGATIVE NEGATIVE   Influenza B by PCR NEGATIVE NEGATIVE  Comprehensive metabolic panel  Result Value Ref Range   Sodium 138 135 - 145 mmol/L   Potassium 3.6 3.5 - 5.1 mmol/L   Chloride 102 98 - 111 mmol/L   CO2 27 22 - 32 mmol/L   Glucose, Bld 113 (H) 70 -  99 mg/dL   BUN 17 8 - 23 mg/dL   Creatinine, Ser 0.67 0.44 - 1.00 mg/dL   Calcium 9.2 8.9 - 10.3 mg/dL   Total Protein 7.2 6.5 - 8.1 g/dL   Albumin 4.0 3.5 - 5.0 g/dL   AST 14 (L) 15 - 41 U/L   ALT 9 0 - 44 U/L   Alkaline Phosphatase 96 38 - 126 U/L   Total Bilirubin 0.2 (L) 0.3 - 1.2 mg/dL   GFR, Estimated >60 >60 mL/min   Anion gap 9 5 - 15  CBC with Differential  Result Value Ref Range   WBC 10.5 4.0 - 10.5 K/uL   RBC 4.63 3.87 - 5.11 MIL/uL   Hemoglobin 13.9 12.0 - 15.0 g/dL   HCT 42.1 36.0 - 46.0 %   MCV 90.9 80.0 - 100.0 fL   MCH 30.0 26.0 - 34.0 pg   MCHC 33.0 30.0 - 36.0 g/dL   RDW 12.9 11.5 - 15.5 %   Platelets 356 150 - 400 K/uL   nRBC 0.0 0.0 - 0.2 %   Neutrophils Relative % 68 %   Neutro Abs 7.2 1.7 - 7.7 K/uL   Lymphocytes Relative 23 %   Lymphs Abs 2.4 0.7 - 4.0 K/uL   Monocytes Relative 7 %   Monocytes Absolute 0.7 0.1 - 1.0 K/uL   Eosinophils Relative 1 %   Eosinophils Absolute 0.1 0.0 - 0.5 K/uL   Basophils Relative 1 %   Basophils Absolute 0.1 0.0 - 0.1 K/uL   Immature Granulocytes 0 %   Abs Immature Granulocytes 0.04 0.00 - 0.07 K/uL   MM 3D SCREEN BREAST BILATERAL  Result Date: 12/15/2020 CLINICAL DATA:  Screening. EXAM: DIGITAL SCREENING BILATERAL MAMMOGRAM WITH TOMOSYNTHESIS AND CAD TECHNIQUE: Bilateral screening digital craniocaudal and mediolateral oblique mammograms were obtained. Bilateral screening digital breast tomosynthesis was performed. The images were evaluated with computer-aided  detection. COMPARISON:  Previous exam(s). ACR Breast Density Category b: There are scattered areas of fibroglandular density. FINDINGS: There are no findings suspicious for malignancy. IMPRESSION: No mammographic evidence of malignancy. A result letter of this screening mammogram will be mailed directly to the patient. RECOMMENDATION: Screening mammogram in one year. (Code:SM-B-01Y) BI-RADS CATEGORY  1: Negative. Electronically Signed   By: Ammie Ferrier M.D.   On: 12/15/2020 15:05     EKG None  Radiology No results found.  Procedures Procedures   Medications Ordered in ED Medications  sodium chloride 0.9 % bolus 500 mL (500 mLs Intravenous New Bag/Given 01/02/21 2355)    ED Course  I have reviewed the triage vital signs and the nursing notes.  Pertinent labs & imaging results that were available during my care of the patient were reviewed by me and considered in my medical decision making (see chart for details).   Feeling improved post IVF.  Labs do not show dehydration.  Patient is very well appearing with no life threatening complaints.    Jenna Lowe was evaluated in Emergency Department on 01/03/2021 for the symptoms described in the history of present illness. She was evaluated in the context of the global COVID-19 pandemic, which necessitated consideration that the patient might be at risk for infection with the SARS-CoV-2 virus that causes COVID-19. Institutional protocols and algorithms that pertain to the evaluation of patients at risk for COVID-19 are in a state of rapid change based on information released by regulatory bodies including the CDC and federal and state organizations. These policies and algorithms were followed during the  patient's care in the ED.  Final Clinical Impression(s) / ED Diagnoses Final diagnoses:  None     None      Return for intractable cough, coughing up blood, fevers > 100.4 unrelieved by medication, shortness of breath,  intractable vomiting, chest pain, shortness of breath, weakness, numbness, changes in speech, facial asymmetry, abdominal pain, passing out, Inability to tolerate liquids or food, cough, altered mental status or any concerns. No signs of systemic illness or infection. The patient is nontoxic-appearing on exam and vital signs are within normal limits. I have reviewed the triage vital signs and the nursing notes. Pertinent labs & imaging results that were available during my care of the patient were reviewed by me and considered in my medical decision making (see chart for details). After history, exam, and medical workup I feel the patient has been appropriately medically screened and is safe for discharge home. Pertinent diagnoses were discussed with the patient. Patient was given return precautions. Rx / DC Orders ED Discharge Orders     None        Clydene Burack, MD 01/03/21 [id number]

## 2021-02-12 ENCOUNTER — Encounter: Payer: Self-pay | Admitting: Physician Assistant

## 2021-02-12 ENCOUNTER — Ambulatory Visit: Payer: Medicare Other | Admitting: Physician Assistant

## 2021-02-12 ENCOUNTER — Other Ambulatory Visit: Payer: Self-pay | Admitting: *Deleted

## 2021-02-12 ENCOUNTER — Ambulatory Visit (HOSPITAL_COMMUNITY)
Admission: RE | Admit: 2021-02-12 | Discharge: 2021-02-12 | Disposition: A | Discharge status: Home / Self Care | Payer: Medicare Other | Source: Ambulatory Visit | Attending: Vascular Surgery | Admitting: Vascular Surgery

## 2021-02-12 ENCOUNTER — Other Ambulatory Visit: Payer: Self-pay

## 2021-02-12 VITALS — BP 147/77 | HR 78 | Temp 98.0°F | Resp 20 | Ht 63.0 in | Wt 128.3 lb

## 2021-02-12 DIAGNOSIS — I6522 Occlusion and stenosis of left carotid artery: Secondary | ICD-10-CM

## 2021-02-12 DIAGNOSIS — I6523 Occlusion and stenosis of bilateral carotid arteries: Secondary | ICD-10-CM

## 2021-02-12 NOTE — Progress Notes (Signed)
History of Present Illness:  Patient is a 75 y.o. year old female who presents for evaluation of carotid stenosis.  S/P left CEA in 20019 for asymptomatic carotid stenosis by Dr. Donnetta Hutching.   There was proximal stenosis in the common carotid on the left.  She is here today for repeat carotid duplex.  The patient denies symptoms of TIA, amaurosis, or stroke.  The patient is currently on ASA antiplatelet therapy.    Past Medical History:  Diagnosis Date   Breast cancer ([place]) 2018   Left breast   Breast cancer of upper-outer quadrant of left female breast ([place])    H/O seasonal allergies    History of kidney stones    Hyperlipidemia    Hypertension    Personal history of radiation therapy     Past Surgical History:  Procedure Laterality Date   BREAST BIOPSY Right 2018   benign   BREAST LUMPECTOMY Left 07/22/2016   BREAST LUMPECTOMY WITH RADIOACTIVE SEED AND SENTINEL LYMPH NODE BIOPSY Left 07/22/2016   Procedure: BREAST LUMPECTOMY WITH RADIOACTIVE SEED AND SENTINEL LYMPH NODE BIOPSY;  Surgeon: Excell Seltzer, MD;  Location: [facility];  Service: General;  Laterality: Left;   ENDARTERECTOMY Left 08/03/2017   Procedure: ENDARTERECTOMY CAROTID LEFT;  Surgeon: Rosetta Posner, MD;  Location: [facility] OR;  Service: Vascular;  Laterality: Left;   NO PAST SURGERIES       Social History Social History   Tobacco Use   Smoking status: Every Day    Packs/day: 1.00    Years: 30.00    Pack years: 30.00    Types: Cigarettes   Smokeless tobacco: Never  Vaping Use   Vaping Use: Never used  Substance Use Topics   Alcohol use: No   Drug use: No    Family History Family History  Problem Relation Age of Onset   Lung cancer Sister    Heart disease Sister    Hypertension Sister    Cancer Sister    Stroke Sister    COPD Sister    Kidney disease Mother    Heart disease Father     Allergies  Allergies  Allergen Reactions   Pseudoephedrine Other (See Comments)     Insomnia   Sulfa Antibiotics Itching   Arimidex [Anastrozole] Other (See Comments)    Night Sweats   Amoxicillin-Pot Clavulanate Diarrhea     Current Outpatient Medications  Medication Sig Dispense Refill   ALPRAZolam (XANAX) 0.25 MG tablet Take 0.25 mg by mouth every 6 (six) hours as needed.     aspirin 81 MG tablet Take 81 mg by mouth daily.     cetirizine (ZYRTEC ALLERGY) 10 MG tablet Take 1 tablet (10 mg total) by mouth daily.     cholecalciferol (VITAMIN D) 25 MCG (1000 UNIT) tablet 1 capsule     fluticasone (FLONASE) 50 MCG/ACT nasal spray Place into both nostrils daily.     hydrochlorothiazide (MICROZIDE) 12.5 MG capsule Take 1 capsule (12.5 mg total) by mouth daily.     ibuprofen (ADVIL,MOTRIN) 200 MG tablet Take 200 mg by mouth every 8 (eight) hours as needed for mild pain.     lisinopril (PRINIVIL,ZESTRIL) 20 MG tablet Take 20 mg by mouth daily.     Multiple Vitamins-Minerals (MULTIVITAMIN WITH MINERALS) tablet Take 1 tablet by mouth daily.     pantoprazole (PROTONIX) 40 MG tablet Take 40 mg by mouth daily.     No current facility-administered medications for this visit.  ROS:   General:  No weight loss, Fever, chills  HEENT: No recent headaches, no nasal bleeding, no visual changes, no sore throat  Neurologic: No dizziness, blackouts, seizures. No recent symptoms of stroke or mini- stroke. No recent episodes of slurred speech, or temporary blindness.  Cardiac: No recent episodes of chest pain/pressure, no shortness of breath at rest.  No shortness of breath with exertion.  Denies history of atrial fibrillation or irregular heartbeat  Vascular: No history of rest pain in feet.  No history of claudication.  No history of non-healing ulcer, No history of DVT   Pulmonary: No home oxygen, no productive cough, no hemoptysis,  No asthma or wheezing  Musculoskeletal:  '[ ]'$  Arthritis, '[ ]'$  Low back pain,  '[ ]'$  Joint pain  Hematologic:No history of hypercoagulable state.  No  history of easy bleeding.  No history of anemia  Gastrointestinal: No hematochezia or melena,  No gastroesophageal reflux, no trouble swallowing  Urinary: '[ ]'$  chronic Kidney disease, '[ ]'$  on HD - '[ ]'$  MWF or '[ ]'$  TTHS, '[ ]'$  Burning with urination, '[ ]'$  Frequent urination, '[ ]'$  Difficulty urinating;   Skin: No rashes  Psychological: No history of anxiety,  No history of depression   Physical Examination  Vitals:   02/12/21 1120 02/12/21 1122  BP: (!) 148/84 (!) 147/77  Pulse: 78   Resp: 20   Temp: 98 F (36.7 C)   TempSrc: Temporal   SpO2: 97%   Weight: 128 lb 4.8 oz (58.2 kg)   Height: '5\' 3"'$  (1.6 m)     Body mass index is 22.73 kg/m.  General:  Alert and oriented, no acute distress HEENT: Normal Neck: positive left bruit or JVD Pulmonary: Clear to auscultation bilaterally Cardiac: Regular Rate and Rhythm without murmur Gastrointestinal: Soft, non-tender, non-distended, no mass, no scars Skin: No rash Extremity Pulses:  2+ radial, brachial, femoral, doppler brisk dorsalis pedis, posterior tibial  bilaterally Musculoskeletal: No deformity or edema  Neurologic: Upper and lower extremity motor 5/5 and symmetric  DATA:      Right Carotid Findings:  +----------+--------+--------+--------+------------------+--------+            PSV cm/sEDV cm/sStenosisPlaque DescriptionComments  +----------+--------+--------+--------+------------------+--------+  CCA Prox  109     18                                          +----------+--------+--------+--------+------------------+--------+  CCA Mid   90      21                                          +----------+--------+--------+--------+------------------+--------+  CCA Distal57      16                                          +----------+--------+--------+--------+------------------+--------+  ICA Prox  80      24      1-39%   heterogenous                 +----------+--------+--------+--------+------------------+--------+  ICA Mid   97      25                                          +----------+--------+--------+--------+------------------+--------+  ICA Distal78      24                                          +----------+--------+--------+--------+------------------+--------+  ECA       105     15                                          +----------+--------+--------+--------+------------------+--------+   +----------+--------+-------+----------------+-------------------+            PSV cm/sEDV cmsDescribe        Arm Pressure (mmHG)  +----------+--------+-------+----------------+-------------------+  [id number]            Multiphasic, WNL                     +----------+--------+-------+----------------+-------------------+   +---------+--------+---+--------+--+---------+  VertebralPSV cm/s106EDV cm/s29Antegrade  +---------+--------+---+--------+--+---------+       Left Carotid Findings:  +----------+--------+--------+--------+------------------+--------+            PSV cm/sEDV cm/sStenosisPlaque DescriptionComments  +----------+--------+--------+--------+------------------+--------+  CCA Prox  66      22                                          +----------+--------+--------+--------+------------------+--------+  CCA Mid   52      21                                          +----------+--------+--------+--------+------------------+--------+  CCA Distal530     195     >50%    homogeneous       80-99%    +----------+--------+--------+--------+------------------+--------+  ICA Prox  102     33      1-39%                               +----------+--------+--------+--------+------------------+--------+  ICA Mid   78      31                                          +----------+--------+--------+--------+------------------+--------+  ICA Distal46       20                                          +----------+--------+--------+--------+------------------+--------+  ECA       102     9                                           +----------+--------+--------+--------+------------------+--------+   +----------+--------+--------+----------------+-------------------+            PSV cm/sEDV cm/sDescribe        Arm Pressure (mmHG)  +----------+--------+--------+----------------+-------------------+  [id number]             Multiphasic, WNL                     +----------+--------+--------+----------------+-------------------+   +---------+--------+--+--------+--+---------+  VertebralPSV cm/s92EDV cm/s15Antegrade  +---------+--------+--+--------+--+---------+           Summary:  Right Carotid: Velocities in the right ICA are consistent with a 1-39%  stenosis.   Left Carotid: Velocities at the proximal endarterectomy/distal CCA are                consistent with 80-99% stenosis by ICA criteria.   Vertebrals:  Bilateral vertebral arteries demonstrate antegrade flow.  Subclavians: Normal flow hemodynamics were seen in bilateral subclavian               arteries.   ASSESSMENT/PLAN: S/P left CEA by Dr. Donnetta Hutching in 2019 for asymptomatic carotid stenosis. She has had increase velocities of the left CCA on duplex and today she has > 90% stenosis of the distal CCA.  I discussed the findings with Dr. Carlis Abbott and we will order a CTA of the neck for details and she will f/u with Dr. Carlis Abbott.        Roxy Horseman PA-C [facility] Office: [phone]  MD in clinic [place]

## 2021-02-22 ENCOUNTER — Ambulatory Visit (HOSPITAL_COMMUNITY)
Admission: RE | Admit: 2021-02-22 | Discharge: 2021-02-22 | Disposition: A | Discharge status: Home / Self Care | Payer: Medicare Other | Source: Ambulatory Visit | Attending: Vascular Surgery | Admitting: Vascular Surgery

## 2021-02-22 ENCOUNTER — Other Ambulatory Visit: Payer: Self-pay

## 2021-02-22 DIAGNOSIS — I6523 Occlusion and stenosis of bilateral carotid arteries: Secondary | ICD-10-CM | POA: Insufficient documentation

## 2021-02-22 LAB — POCT I-STAT CREATININE: Creatinine, Ser: 1 mg/dL (ref 0.44–1.00)

## 2021-02-22 MED ORDER — IOHEXOL 350 MG/ML SOLN
80.0000 mL | Freq: Once | INTRAVENOUS | Status: AC | PRN
  Administered 2021-02-22: 80 mL via INTRAVENOUS

## 2021-02-26 ENCOUNTER — Ambulatory Visit (INDEPENDENT_AMBULATORY_CARE_PROVIDER_SITE_OTHER): Payer: Medicare Other | Admitting: Vascular Surgery

## 2021-02-26 ENCOUNTER — Other Ambulatory Visit: Payer: Self-pay

## 2021-02-26 ENCOUNTER — Encounter: Payer: Self-pay | Admitting: Vascular Surgery

## 2021-02-26 VITALS — BP 156/77 | HR 68 | Temp 97.6°F | Resp 14 | Ht 63.0 in | Wt 125.0 lb

## 2021-02-26 DIAGNOSIS — I6522 Occlusion and stenosis of left carotid artery: Secondary | ICD-10-CM

## 2021-02-26 MED ORDER — CLOPIDOGREL BISULFATE 75 MG PO TABS: 75.0000 mg | ORAL_TABLET | Freq: Every day | ORAL | 6 refills | Status: AC

## 2021-02-26 MED ORDER — SIMVASTATIN 10 MG PO TABS: 10.0000 mg | ORAL_TABLET | Freq: Every day | ORAL | 1 refills | Status: AC

## 2021-02-26 NOTE — Progress Notes (Signed)
Patient name: Jenna Lowe MRN: [id number] DOB: [birth year] Sex: female  REASON FOR VISIT: Follow-up after CTA neck to evaluate left common carotid artery stenosis  HPI: Jenna Lowe is a 75 y.o. female with history of breast cancer as well as hypertension and hyperlipidemia that presents to discuss left common carotid artery high-grade stenosis after remote left carotid endarterectomy.  Patient was previously under the care of Dr. Donnetta Hutching.  She had a left carotid endarterectomy in 2019 for an asymptomatic high-grade stenosis.  She was recently seen in follow-up by the PA and was noted to have high-grade stenosis in the left distal common carotid artery.  She was sent for CTA and is here to see me today.  She denies any history of strokes or TIAs.  She is on aspirin.  States trouble with statins in the past due to myalgia.  Past Medical History:  Diagnosis Date   Breast cancer ([place]) 2018   Left breast   Breast cancer of upper-outer quadrant of left female breast ([place])    H/O seasonal allergies    History of kidney stones    Hyperlipidemia    Hypertension    Personal history of radiation therapy     Past Surgical History:  Procedure Laterality Date   BREAST BIOPSY Right 2018   benign   BREAST LUMPECTOMY Left 07/22/2016   BREAST LUMPECTOMY WITH RADIOACTIVE SEED AND SENTINEL LYMPH NODE BIOPSY Left 07/22/2016   Procedure: BREAST LUMPECTOMY WITH RADIOACTIVE SEED AND SENTINEL LYMPH NODE BIOPSY;  Surgeon: Excell Seltzer, MD;  Location: [facility];  Service: General;  Laterality: Left;   ENDARTERECTOMY Left 08/03/2017   Procedure: ENDARTERECTOMY CAROTID LEFT;  Surgeon: Rosetta Posner, MD;  Location: [facility] OR;  Service: Vascular;  Laterality: Left;   NO PAST SURGERIES      Family History  Problem Relation Age of Onset   Lung cancer Sister    Heart disease Sister    Hypertension Sister    Cancer Sister    Stroke Sister    COPD Sister    Kidney disease  Mother    Heart disease Father     SOCIAL HISTORY: Social History   Tobacco Use   Smoking status: Every Day    Packs/day: 1.00    Years: 30.00    Pack years: 30.00    Types: Cigarettes   Smokeless tobacco: Never  Substance Use Topics   Alcohol use: No    Allergies  Allergen Reactions   Pseudoephedrine Other (See Comments)    Insomnia   Sulfa Antibiotics Itching   Arimidex [Anastrozole] Other (See Comments)    Night Sweats   Amoxicillin-Pot Clavulanate Diarrhea    Current Outpatient Medications  Medication Sig Dispense Refill   ALPRAZolam (XANAX) 0.25 MG tablet Take 0.25 mg by mouth every 6 (six) hours as needed.     aspirin 81 MG tablet Take 81 mg by mouth daily.     cetirizine (ZYRTEC ALLERGY) 10 MG tablet Take 1 tablet (10 mg total) by mouth daily.     cholecalciferol (VITAMIN D) 25 MCG (1000 UNIT) tablet 1 capsule     fluticasone (FLONASE) 50 MCG/ACT nasal spray Place into both nostrils daily.     hydrochlorothiazide (MICROZIDE) 12.5 MG capsule Take 1 capsule (12.5 mg total) by mouth daily.     ibuprofen (ADVIL,MOTRIN) 200 MG tablet Take 200 mg by mouth every 8 (eight) hours as needed for mild pain.     lisinopril (PRINIVIL,ZESTRIL) 20 MG tablet Take  20 mg by mouth daily.     Multiple Vitamins-Minerals (MULTIVITAMIN WITH MINERALS) tablet Take 1 tablet by mouth daily.     pantoprazole (PROTONIX) 40 MG tablet Take 40 mg by mouth daily.     No current facility-administered medications for this visit.    REVIEW OF SYSTEMS:  [X]  denotes positive finding, [ ]  denotes negative finding Cardiac  Comments:  Chest pain or chest pressure:    Shortness of breath upon exertion:    Short of breath when lying flat:    Irregular heart rhythm:        Vascular    Pain in calf, thigh, or hip brought on by ambulation:    Pain in feet at night that wakes you up from your sleep:     Blood clot in your veins:    Leg swelling:         Pulmonary    Oxygen at home:    Productive  cough:     Wheezing:         Neurologic    Sudden weakness in arms or legs:     Sudden numbness in arms or legs:     Sudden onset of difficulty speaking or slurred speech:    Temporary loss of vision in one eye:     Problems with dizziness:         Gastrointestinal    Blood in stool:     Vomited blood:         Genitourinary    Burning when urinating:     Blood in urine:        Psychiatric    Major depression:         Hematologic    Bleeding problems:    Problems with blood clotting too easily:        Skin    Rashes or ulcers:        Constitutional    Fever or chills:      PHYSICAL EXAM: Vitals:   02/26/21 0926 02/26/21 0932  BP: (!) 157/94 (!) 156/77  Pulse: 70 68  Resp: 14   Temp: 97.6 F (36.4 C)   TempSrc: Temporal   SpO2: 95%   Weight: 125 lb (56.7 kg)   Height: 5\' 3"  (1.6 m)     GENERAL: The patient is a well-nourished female, in no acute distress. The vital signs are documented above. CARDIAC: There is a regular rate and rhythm.  VASCULAR:  Palpable radial pulses bilaterally PULMONARY: No respiratory distress. ABDOMEN: Soft and non-tender. MUSCULOSKELETAL: There are no major deformities or cyanosis. NEUROLOGIC: No focal weakness or paresthesias are detected.  CN II-XII grossly intact. SKIN: There are no ulcers or rashes noted. PSYCHIATRIC: The patient has a normal affect.  DATA:   Carotid duplex 02/12/2021 shows high-grade stenosis in the distal left common carotid with a velocity 530/195 consistent with high-grade stenosis 80-99%  CTA neck on my review shows a high-grade stenosis with soft plaque in the distal left common carotid just before the patch  Assessment/Plan:  75 year old female presents with high-grade common carotid stenosis in the distal left common carotid artery just before the patch.  This is in the setting of previous left carotid endarterectomy in 2019 by Dr. Donnetta Hutching for an asymptomatic high-grade stenosis.  I have recommended a  left TCAR with flow reversal with angioplasty and stent placement.  We discussed this being done in the operating room for stroke risk reduction.  I have started her on Plavix as  well as a simvastatin.  She is going to the beach in early [place] so we will schedule her surgery for the week she returns.  Discussed 1% risk of perioperative stroke as well as other risks.  We will get her scheduled today.   Marty Heck, MD [facility] Office: [phone]

## 2021-03-06 NOTE — Pre-Procedure Instructions (Signed)
Surgical Instructions   Your procedure is scheduled on Wednesday, October 19th. Report to [facility] Main Entrance "A" at 06:30 A.M., then check in with the Admitting office. Call this number if you have problems the morning of surgery: [phone]   If you have any questions prior to your surgery date call [phone]: Open Monday-Friday 8am-4pm   Remember: Do not eat or drink after midnight the night before your surgery     Take these medicines the morning of surgery with A SIP OF WATER  cetirizine (ZYRTEC ALLERGY) fluticasone (FLONASE) pantoprazole (PROTONIX)   If needed: ALPRAZolam Duanne Moron)  As of today, STOP taking any Aspirin (unless otherwise instructed by your surgeon) Aleve, Naproxen, Ibuprofen, Motrin, Advil, Goody's, BC's, all herbal medications, fish oil, and all vitamins.                     Do NOT Smoke (Tobacco/Vaping) or drink Alcohol 24 hours prior to your procedure.  If you use a CPAP at night, you may bring all equipment for your overnight stay.   Contacts, glasses, piercing's, hearing aid's, dentures or partials may not be worn into surgery, please bring cases for these belongings.    For patients admitted to the hospital, discharge time will be determined by your treatment team.   Patients discharged the day of surgery will not be allowed to drive home, and someone needs to stay with them for 24 hours.  NO VISITORS WILL BE ALLOWED IN PRE-OP WHERE PATIENTS GET READY FOR SURGERY.  ONLY 1 SUPPORT PERSON MAY BE PRESENT IN THE WAITING ROOM WHILE YOU ARE IN SURGERY.  IF YOU ARE TO BE ADMITTED, ONCE YOU ARE IN YOUR ROOM YOU WILL BE ALLOWED TWO (2) VISITORS.  Minor children may have two parents present. Special consideration for safety and communication needs will be reviewed on a case by case basis.   Special instructions:   [facility]- Preparing For Surgery  Before surgery, you can play an important role. Because skin is not sterile, your skin needs to be as  free of germs as possible. You can reduce the number of germs on your skin by washing with CHG (chlorahexidine gluconate) Soap before surgery.  CHG is an antiseptic cleaner which kills germs and bonds with the skin to continue killing germs even after washing.    Oral Hygiene is also important to reduce your risk of infection.  Remember - BRUSH YOUR TEETH THE MORNING OF SURGERY WITH YOUR REGULAR TOOTHPASTE  Please do not use if you have an allergy to CHG or antibacterial soaps. If your skin becomes reddened/irritated stop using the CHG.  Do not shave (including legs and underarms) for at least 48 hours prior to first CHG shower. It is OK to shave your face.  Please follow these instructions carefully.   Shower the NIGHT BEFORE SURGERY and the MORNING OF SURGERY  If you chose to wash your hair, wash your hair first as usual with your normal shampoo.  After you shampoo, rinse your hair and body thoroughly to remove the shampoo.  Use CHG Soap as you would any other liquid soap. You can apply CHG directly to the skin and wash gently with a scrungie or a clean washcloth.   Apply the CHG Soap to your body ONLY FROM THE NECK DOWN.  Do not use on open wounds or open sores. Avoid contact with your eyes, ears, mouth and genitals (private parts). Wash Face and genitals (private parts)  with your normal  soap.   Wash thoroughly, paying special attention to the area where your surgery will be performed.  Thoroughly rinse your body with warm water from the neck down.  DO NOT shower/wash with your normal soap after using and rinsing off the CHG Soap.  Pat yourself dry with a CLEAN TOWEL.  Wear CLEAN PAJAMAS to bed the night before surgery  Place CLEAN SHEETS on your bed the night before your surgery  DO NOT SLEEP WITH PETS.   Day of Surgery: Shower with CHG soap. Do not wear jewelry, make up, nail polish, gel polish, artificial nails, or any other type of covering on natural nails including  finger and toenails. If patients have artificial nails, gel coating, etc. that need to be removed by a nail salon please have this removed prior to surgery. Surgery may need to be canceled/delayed if the surgeon/ anesthesia feels like the patient is unable to be adequately monitored. Do not wear lotions, powders, perfumes, or deodorant. Do not shave 48 hours prior to surgery.   Do not bring valuables to the hospital. [facility] is not responsible for any belongings or valuables. Wear Clean/Comfortable clothing the morning of surgery Remember to brush your teeth WITH YOUR REGULAR TOOTHPASTE.   Please read over the following fact sheets that you were given.   3 days prior to your procedure or After your COVID test   You are not required to quarantine however you are required to wear a well-fitting mask when you are out and around people not in your household. If your mask becomes wet or soiled, replace with a new one.   Wash your hands often with soap and water for 20 seconds or clean your hands with an alcohol-based hand sanitizer that contains at least 60% alcohol.   Do not share personal items.   Notify your provider:  o if you are in close contact with someone who has COVID  o or if you develop a fever of 100.4 or greater, sneezing, cough, sore throat, shortness of breath or body aches.

## 2021-03-07 ENCOUNTER — Other Ambulatory Visit: Payer: Self-pay

## 2021-03-07 ENCOUNTER — Encounter (HOSPITAL_COMMUNITY)
Admission: RE | Admit: 2021-03-07 | Discharge: 2021-03-07 | Disposition: A | Discharge status: Home / Self Care | Payer: Medicare Other | Source: Ambulatory Visit | Attending: Vascular Surgery | Admitting: Vascular Surgery

## 2021-03-07 ENCOUNTER — Encounter (HOSPITAL_COMMUNITY): Payer: Self-pay

## 2021-03-07 DIAGNOSIS — Z01818 Encounter for other preprocedural examination: Secondary | ICD-10-CM | POA: Diagnosis present

## 2021-03-07 LAB — COMPREHENSIVE METABOLIC PANEL
ALT: 18 U/L (ref 0–44)
AST: 29 U/L (ref 15–41)
Albumin: 3.9 g/dL (ref 3.5–5.0)
Alkaline Phosphatase: 89 U/L (ref 38–126)
Anion gap: 8 (ref 5–15)
BUN: 11 mg/dL (ref 8–23)
CO2: 28 mmol/L (ref 22–32)
Calcium: 9.6 mg/dL (ref 8.9–10.3)
Chloride: 103 mmol/L (ref 98–111)
Creatinine, Ser: 1.15 mg/dL — ABNORMAL HIGH (ref 0.44–1.00)
GFR, Estimated: 49 mL/min — ABNORMAL LOW (ref >60)
Glucose, Bld: 145 mg/dL — ABNORMAL HIGH (ref 70–99)
Potassium: 3.1 mmol/L — ABNORMAL LOW (ref 3.5–5.1)
Sodium: 139 mmol/L (ref 135–145)
Total Bilirubin: 0.7 mg/dL (ref 0.3–1.2)
Total Protein: 7.2 g/dL (ref 6.5–8.1)

## 2021-03-07 LAB — TYPE AND SCREEN
ABO/RH(D): A POS
Antibody Screen: NEGATIVE

## 2021-03-07 LAB — URINALYSIS, ROUTINE W REFLEX MICROSCOPIC
Bilirubin Urine: NEGATIVE
Glucose, UA: NEGATIVE mg/dL
Hgb urine dipstick: NEGATIVE
Ketones, ur: NEGATIVE mg/dL
Leukocytes,Ua: NEGATIVE
Nitrite: NEGATIVE
Protein, ur: NEGATIVE mg/dL
Specific Gravity, Urine: 1.006 (ref 1.005–1.030)
pH: 6 (ref 5.0–8.0)

## 2021-03-07 LAB — SURGICAL PCR SCREEN
MRSA, PCR: NEGATIVE
Staphylococcus aureus: NEGATIVE

## 2021-03-07 LAB — APTT: aPTT: 29 seconds (ref 24–36)

## 2021-03-07 LAB — CBC
HCT: 47.8 % — ABNORMAL HIGH (ref 36.0–46.0)
Hemoglobin: 15.4 g/dL — ABNORMAL HIGH (ref 12.0–15.0)
MCH: 30.4 pg (ref 26.0–34.0)
MCHC: 32.2 g/dL (ref 30.0–36.0)
MCV: 94.3 fL (ref 80.0–100.0)
Platelets: 308 10*3/uL (ref 150–400)
RBC: 5.07 MIL/uL (ref 3.87–5.11)
RDW: 13.1 % (ref 11.5–15.5)
WBC: 9 10*3/uL (ref 4.0–10.5)
nRBC: 0 % (ref 0.0–0.2)

## 2021-03-07 LAB — PROTIME-INR
INR: 0.9 (ref 0.8–1.2)
Prothrombin Time: 12.6 seconds (ref 11.4–15.2)

## 2021-03-07 NOTE — Progress Notes (Signed)
PCP - Dr. Prince Solian Cardiologist - denies  PPM/ICD - denies   Chest x-ray - pt states she thinks she had one "years ago" EKG - 03/07/21 at PAT appt Stress Test - denies ECHO - denies Cardiac Cath - denies  Sleep Study - Pt states she had one "years ago" but was not diagnosed with sleep apnea   DM- denies  Blood Thinner/ASA Instructions: Pt instructed to continue ASA and Plavix.   ERAS Protcol - No, NPO   COVID TEST- Pt scheduled for testing on 03/19/21   Anesthesia review: No  Patient denies shortness of breath, fever, cough and chest pain at PAT appointment   All instructions explained to the patient, with a verbal understanding of the material. Patient agrees to go over the instructions while at home for a better understanding. Patient also instructed to self quarantine after being tested for COVID-19. The opportunity to ask questions was provided.

## 2021-03-19 ENCOUNTER — Other Ambulatory Visit (HOSPITAL_COMMUNITY)
Admission: RE | Admit: 2021-03-19 | Discharge: 2021-03-19 | Disposition: A | Discharge status: Home / Self Care | Payer: Medicare Other | Source: Ambulatory Visit | Attending: Vascular Surgery | Admitting: Vascular Surgery

## 2021-03-19 DIAGNOSIS — Z01812 Encounter for preprocedural laboratory examination: Secondary | ICD-10-CM | POA: Insufficient documentation

## 2021-03-19 DIAGNOSIS — Z20822 Contact with and (suspected) exposure to covid-19: Secondary | ICD-10-CM | POA: Insufficient documentation

## 2021-03-19 LAB — SARS CORONAVIRUS 2 (TAT 6-24 HRS): SARS Coronavirus 2: NEGATIVE

## 2021-03-19 NOTE — Anesthesia Preprocedure Evaluation (Addendum)
Anesthesia Evaluation  Patient identified by MRN, date of birth, ID band Patient awake    Reviewed: Allergy & Precautions, NPO status , Patient's Chart, lab work & pertinent test results  History of Anesthesia Complications Negative for: history of anesthetic complications  Airway Mallampati: II  TM Distance: >3 FB Neck ROM: Full    Dental  (+) Dental Advisory Given   Pulmonary Current Smoker and Patient abstained from smoking.,  03/19/2021 SARS coronavirus NEG   breath sounds clear to auscultation       Cardiovascular hypertension, Pt. on medications (-) angina+ Peripheral Vascular Disease   Rhythm:Regular Rate:Normal     Neuro/Psych Depression negative neurological ROS     GI/Hepatic Neg liver ROS, GERD  Medicated and Controlled,  Endo/Other  negative endocrine ROS  Renal/GU negative Renal ROS     Musculoskeletal   Abdominal   Peds  Hematology negative hematology ROS (+)   Anesthesia Other Findings H/o breast cancer  Reproductive/Obstetrics                            Anesthesia Physical Anesthesia Plan  ASA: 3  Anesthesia Plan: General   Post-op Pain Management:    Induction: Intravenous  PONV Risk Score and Plan: 2 and Ondansetron and Dexamethasone  Airway Management Planned: Oral ETT  Additional Equipment: Arterial line  Intra-op Plan:   Post-operative Plan: Extubation in OR  Informed Consent: I have reviewed the patients History and Physical, chart, labs and discussed the procedure including the risks, benefits and alternatives for the proposed anesthesia with the patient or authorized representative who has indicated his/her understanding and acceptance.     Dental advisory given  Plan Discussed with: CRNA and Surgeon  Anesthesia Plan Comments:        Anesthesia Quick Evaluation

## 2021-03-20 ENCOUNTER — Other Ambulatory Visit: Payer: Self-pay

## 2021-03-20 ENCOUNTER — Inpatient Hospital Stay (HOSPITAL_COMMUNITY): Payer: Medicare Other | Admitting: Anesthesiology

## 2021-03-20 ENCOUNTER — Encounter (HOSPITAL_COMMUNITY)
Admission: RE | Disposition: A | Discharge status: Home / Self Care | Payer: Self-pay | Source: Home / Self Care | Attending: Vascular Surgery

## 2021-03-20 ENCOUNTER — Inpatient Hospital Stay (HOSPITAL_COMMUNITY)
Admission: RE | Admit: 2021-03-20 | Discharge: 2021-03-21 | DRG: 036 | Disposition: A | Discharge status: Home / Self Care | Payer: Medicare Other | Attending: Vascular Surgery | Admitting: Vascular Surgery

## 2021-03-20 ENCOUNTER — Encounter (HOSPITAL_COMMUNITY): Payer: Self-pay | Admitting: Vascular Surgery

## 2021-03-20 ENCOUNTER — Inpatient Hospital Stay (HOSPITAL_COMMUNITY): Payer: Medicare Other

## 2021-03-20 DIAGNOSIS — Z882 Allergy status to sulfonamides status: Secondary | ICD-10-CM | POA: Diagnosis not present

## 2021-03-20 DIAGNOSIS — I6522 Occlusion and stenosis of left carotid artery: Secondary | ICD-10-CM | POA: Diagnosis present

## 2021-03-20 DIAGNOSIS — F17210 Nicotine dependence, cigarettes, uncomplicated: Secondary | ICD-10-CM | POA: Diagnosis present

## 2021-03-20 DIAGNOSIS — Z20822 Contact with and (suspected) exposure to covid-19: Secondary | ICD-10-CM | POA: Diagnosis present

## 2021-03-20 DIAGNOSIS — I10 Essential (primary) hypertension: Secondary | ICD-10-CM | POA: Diagnosis present

## 2021-03-20 DIAGNOSIS — Z888 Allergy status to other drugs, medicaments and biological substances status: Secondary | ICD-10-CM | POA: Diagnosis not present

## 2021-03-20 DIAGNOSIS — Z87442 Personal history of urinary calculi: Secondary | ICD-10-CM | POA: Diagnosis not present

## 2021-03-20 DIAGNOSIS — Z79899 Other long term (current) drug therapy: Secondary | ICD-10-CM | POA: Diagnosis not present

## 2021-03-20 DIAGNOSIS — Z7982 Long term (current) use of aspirin: Secondary | ICD-10-CM

## 2021-03-20 DIAGNOSIS — Z880 Allergy status to penicillin: Secondary | ICD-10-CM | POA: Diagnosis not present

## 2021-03-20 DIAGNOSIS — K219 Gastro-esophageal reflux disease without esophagitis: Secondary | ICD-10-CM | POA: Diagnosis present

## 2021-03-20 DIAGNOSIS — Z853 Personal history of malignant neoplasm of breast: Secondary | ICD-10-CM | POA: Diagnosis not present

## 2021-03-20 DIAGNOSIS — Z8249 Family history of ischemic heart disease and other diseases of the circulatory system: Secondary | ICD-10-CM

## 2021-03-20 DIAGNOSIS — E785 Hyperlipidemia, unspecified: Secondary | ICD-10-CM | POA: Diagnosis present

## 2021-03-20 DIAGNOSIS — Z923 Personal history of irradiation: Secondary | ICD-10-CM

## 2021-03-20 HISTORY — PX: TRANSCAROTID ARTERY REVASCULARIZATIONÂ: SHX6778

## 2021-03-20 HISTORY — PX: ULTRASOUND GUIDANCE FOR VASCULAR ACCESS: SHX6516

## 2021-03-20 LAB — CBC
HCT: 39.3 % (ref 36.0–46.0)
Hemoglobin: 13 g/dL (ref 12.0–15.0)
MCH: 30.4 pg (ref 26.0–34.0)
MCHC: 33.1 g/dL (ref 30.0–36.0)
MCV: 92 fL (ref 80.0–100.0)
Platelets: 244 10*3/uL (ref 150–400)
RBC: 4.27 MIL/uL (ref 3.87–5.11)
RDW: 12.7 % (ref 11.5–15.5)
WBC: 11.9 10*3/uL — ABNORMAL HIGH (ref 4.0–10.5)
nRBC: 0 % (ref 0.0–0.2)

## 2021-03-20 LAB — CREATININE, SERUM
Creatinine, Ser: 0.81 mg/dL (ref 0.44–1.00)
GFR, Estimated: >60 mL/min (ref >60)

## 2021-03-20 LAB — POCT ACTIVATED CLOTTING TIME: Activated Clotting Time: 317 seconds

## 2021-03-20 SURGERY — TRANSCAROTID ARTERY REVASCULARIZATION (TCAR)
Anesthesia: General | Site: Neck | Laterality: Left

## 2021-03-20 MED ORDER — ACETAMINOPHEN 325 MG PO TABS: 325.0000 mg | ORAL_TABLET | ORAL | Status: DC | PRN

## 2021-03-20 MED ORDER — FENTANYL CITRATE (PF) 100 MCG/2ML IJ SOLN
INTRAMUSCULAR | Status: AC
  Filled 2021-03-20: qty 2

## 2021-03-20 MED ORDER — VITAMIN D 25 MCG (1000 UNIT) PO TABS
2000.0000 [IU] | ORAL_TABLET | Freq: Every day | ORAL | Status: DC
  Administered 2021-03-21: 2000 [IU] via ORAL
  Filled 2021-03-20: qty 2

## 2021-03-20 MED ORDER — VANCOMYCIN HCL IN DEXTROSE 1-5 GM/200ML-% IV SOLN
1000.0000 mg | INTRAVENOUS | Status: AC
  Administered 2021-03-20: 1000 mg via INTRAVENOUS
  Filled 2021-03-20: qty 200

## 2021-03-20 MED ORDER — SUGAMMADEX SODIUM 200 MG/2ML IV SOLN
INTRAVENOUS | Status: DC | PRN
  Administered 2021-03-20: 200 mg via INTRAVENOUS

## 2021-03-20 MED ORDER — PROTAMINE SULFATE 10 MG/ML IV SOLN
INTRAVENOUS | Status: DC | PRN
  Administered 2021-03-20: 40 mg via INTRAVENOUS

## 2021-03-20 MED ORDER — LABETALOL HCL 5 MG/ML IV SOLN
10.0000 mg | INTRAVENOUS | Status: DC | PRN
Start: 2021-03-20 — End: 2021-03-21

## 2021-03-20 MED ORDER — 0.9 % SODIUM CHLORIDE (POUR BTL) OPTIME
TOPICAL | Status: DC | PRN
  Administered 2021-03-20: 1000 mL

## 2021-03-20 MED ORDER — METOPROLOL TARTRATE 5 MG/5ML IV SOLN: 2.0000 mg | INTRAVENOUS | Status: DC | PRN

## 2021-03-20 MED ORDER — OXYCODONE-ACETAMINOPHEN 5-325 MG PO TABS
1.0000 | ORAL_TABLET | ORAL | Status: DC | PRN
Start: 2021-03-20 — End: 2021-03-21

## 2021-03-20 MED ORDER — HEPARIN SODIUM (PORCINE) 1000 UNIT/ML IJ SOLN
INTRAMUSCULAR | Status: AC
  Filled 2021-03-20: qty 1

## 2021-03-20 MED ORDER — POTASSIUM CHLORIDE CRYS ER 20 MEQ PO TBCR: 20.0000 meq | EXTENDED_RELEASE_TABLET | Freq: Every day | ORAL | Status: DC | PRN

## 2021-03-20 MED ORDER — SODIUM CHLORIDE 0.9 % IV SOLN: INTRAVENOUS | Status: DC

## 2021-03-20 MED ORDER — BISACODYL 5 MG PO TBEC: 5.0000 mg | DELAYED_RELEASE_TABLET | Freq: Every day | ORAL | Status: DC | PRN

## 2021-03-20 MED ORDER — LIDOCAINE HCL (PF) 1 % IJ SOLN
INTRAMUSCULAR | Status: AC
  Filled 2021-03-20: qty 30

## 2021-03-20 MED ORDER — ASPIRIN 81 MG PO TABS: 81.0000 mg | ORAL_TABLET | Freq: Every day | ORAL | Status: DC

## 2021-03-20 MED ORDER — CHLORHEXIDINE GLUCONATE 0.12 % MT SOLN
15.0000 mL | Freq: Once | OROMUCOSAL | Status: AC
  Administered 2021-03-20: 15 mL via OROMUCOSAL
  Filled 2021-03-20: qty 15

## 2021-03-20 MED ORDER — PHENYLEPHRINE 40 MCG/ML (10ML) SYRINGE FOR IV PUSH (FOR BLOOD PRESSURE SUPPORT)
PREFILLED_SYRINGE | INTRAVENOUS | Status: AC
  Filled 2021-03-20: qty 10

## 2021-03-20 MED ORDER — LIDOCAINE 2% (20 MG/ML) 5 ML SYRINGE
INTRAMUSCULAR | Status: AC
  Filled 2021-03-20: qty 5

## 2021-03-20 MED ORDER — HEMOSTATIC AGENTS (NO CHARGE) OPTIME
TOPICAL | Status: DC | PRN
  Administered 2021-03-20: 1 via TOPICAL

## 2021-03-20 MED ORDER — ONDANSETRON HCL 4 MG/2ML IJ SOLN: 4.0000 mg | Freq: Four times a day (QID) | INTRAMUSCULAR | Status: DC | PRN

## 2021-03-20 MED ORDER — LACTATED RINGERS IV SOLN: INTRAVENOUS | Status: DC | PRN

## 2021-03-20 MED ORDER — ACETAMINOPHEN 500 MG PO TABS
1000.0000 mg | ORAL_TABLET | Freq: Once | ORAL | Status: AC
  Administered 2021-03-20: 1000 mg via ORAL
  Filled 2021-03-20: qty 2

## 2021-03-20 MED ORDER — CLOPIDOGREL BISULFATE 75 MG PO TABS
75.0000 mg | ORAL_TABLET | Freq: Every day | ORAL | Status: DC
  Administered 2021-03-21: 75 mg via ORAL
  Filled 2021-03-20: qty 1

## 2021-03-20 MED ORDER — ADULT MULTIVITAMIN W/MINERALS CH
1.0000 | ORAL_TABLET | Freq: Every day | ORAL | Status: DC
  Administered 2021-03-21: 1 via ORAL
  Filled 2021-03-20: qty 1

## 2021-03-20 MED ORDER — ONDANSETRON HCL 4 MG/2ML IJ SOLN
INTRAMUSCULAR | Status: DC | PRN
  Administered 2021-03-20: 4 mg via INTRAVENOUS

## 2021-03-20 MED ORDER — LACTATED RINGERS IV SOLN: INTRAVENOUS | Status: DC

## 2021-03-20 MED ORDER — MAGNESIUM SULFATE 2 GM/50ML IV SOLN
2.0000 g | Freq: Every day | INTRAVENOUS | Status: DC | PRN
Start: 2021-03-20 — End: 2021-03-21

## 2021-03-20 MED ORDER — PHENYLEPHRINE HCL-NACL 20-0.9 MG/250ML-% IV SOLN
INTRAVENOUS | Status: DC | PRN
  Administered 2021-03-20: 40 ug/min via INTRAVENOUS

## 2021-03-20 MED ORDER — LISINOPRIL 10 MG PO TABS
20.0000 mg | ORAL_TABLET | Freq: Every day | ORAL | Status: DC
  Administered 2021-03-20 – 2021-03-21 (×2): 20 mg via ORAL
  Filled 2021-03-20 (×2): qty 2

## 2021-03-20 MED ORDER — HEPARIN SODIUM (PORCINE) 1000 UNIT/ML IJ SOLN
INTRAMUSCULAR | Status: DC | PRN
  Administered 2021-03-20: 7000 [IU] via INTRAVENOUS

## 2021-03-20 MED ORDER — FENTANYL CITRATE (PF) 250 MCG/5ML IJ SOLN
INTRAMUSCULAR | Status: DC | PRN
  Administered 2021-03-20: 50 ug via INTRAVENOUS
  Administered 2021-03-20: 100 ug via INTRAVENOUS

## 2021-03-20 MED ORDER — SUGAMMADEX SODIUM 500 MG/5ML IV SOLN
INTRAVENOUS | Status: AC
  Filled 2021-03-20: qty 5

## 2021-03-20 MED ORDER — ROCURONIUM BROMIDE 10 MG/ML (PF) SYRINGE
PREFILLED_SYRINGE | INTRAVENOUS | Status: DC | PRN
  Administered 2021-03-20: 60 mg via INTRAVENOUS
  Administered 2021-03-20: 10 mg via INTRAVENOUS

## 2021-03-20 MED ORDER — CHLORHEXIDINE GLUCONATE CLOTH 2 % EX PADS: 6.0000 | MEDICATED_PAD | Freq: Once | CUTANEOUS | Status: DC

## 2021-03-20 MED ORDER — FENTANYL CITRATE (PF) 250 MCG/5ML IJ SOLN
INTRAMUSCULAR | Status: AC
  Filled 2021-03-20: qty 5

## 2021-03-20 MED ORDER — ONDANSETRON HCL 4 MG/2ML IJ SOLN
INTRAMUSCULAR | Status: AC
  Filled 2021-03-20: qty 2

## 2021-03-20 MED ORDER — DOCUSATE SODIUM 100 MG PO CAPS
100.0000 mg | ORAL_CAPSULE | Freq: Every day | ORAL | Status: DC
  Administered 2021-03-21: 100 mg via ORAL
  Filled 2021-03-20: qty 1

## 2021-03-20 MED ORDER — POLYETHYLENE GLYCOL 3350 17 G PO PACK
17.0000 g | PACK | Freq: Every day | ORAL | Status: DC | PRN
Start: 2021-03-20 — End: 2021-03-21

## 2021-03-20 MED ORDER — ASPIRIN EC 81 MG PO TBEC
81.0000 mg | DELAYED_RELEASE_TABLET | Freq: Every day | ORAL | Status: DC
  Administered 2021-03-21: 81 mg via ORAL
  Filled 2021-03-20: qty 1

## 2021-03-20 MED ORDER — FENTANYL CITRATE (PF) 100 MCG/2ML IJ SOLN
25.0000 ug | INTRAMUSCULAR | Status: DC | PRN
  Administered 2021-03-20: 25 ug via INTRAVENOUS

## 2021-03-20 MED ORDER — EPHEDRINE 5 MG/ML INJ
INTRAVENOUS | Status: AC
  Filled 2021-03-20: qty 5

## 2021-03-20 MED ORDER — MORPHINE SULFATE (PF) 2 MG/ML IV SOLN
1.0000 mg | INTRAVENOUS | Status: DC | PRN
Start: 2021-03-20 — End: 2021-03-21

## 2021-03-20 MED ORDER — EPHEDRINE SULFATE-NACL 50-0.9 MG/10ML-% IV SOSY
PREFILLED_SYRINGE | INTRAVENOUS | Status: DC | PRN
  Administered 2021-03-20: 5 mg via INTRAVENOUS

## 2021-03-20 MED ORDER — SIMVASTATIN 20 MG PO TABS
10.0000 mg | ORAL_TABLET | Freq: Every day | ORAL | Status: DC
  Administered 2021-03-20: 10 mg via ORAL
  Filled 2021-03-20: qty 1

## 2021-03-20 MED ORDER — PANTOPRAZOLE SODIUM 40 MG PO TBEC
40.0000 mg | DELAYED_RELEASE_TABLET | Freq: Every day | ORAL | Status: DC
  Administered 2021-03-20 – 2021-03-21 (×2): 40 mg via ORAL
  Filled 2021-03-20: qty 1

## 2021-03-20 MED ORDER — GUAIFENESIN-DM 100-10 MG/5ML PO SYRP: 15.0000 mL | ORAL_SOLUTION | ORAL | Status: DC | PRN

## 2021-03-20 MED ORDER — PROPOFOL 10 MG/ML IV BOLUS
INTRAVENOUS | Status: AC
  Filled 2021-03-20: qty 40

## 2021-03-20 MED ORDER — PHENOL 1.4 % MT LIQD: 1.0000 | OROMUCOSAL | Status: DC | PRN

## 2021-03-20 MED ORDER — PROPOFOL 10 MG/ML IV BOLUS
INTRAVENOUS | Status: DC | PRN
  Administered 2021-03-20: 120 mg via INTRAVENOUS

## 2021-03-20 MED ORDER — HEPARIN 6000 UNIT IRRIGATION SOLUTION
Status: AC
  Filled 2021-03-20: qty 500

## 2021-03-20 MED ORDER — ROCURONIUM BROMIDE 10 MG/ML (PF) SYRINGE
PREFILLED_SYRINGE | INTRAVENOUS | Status: AC
  Filled 2021-03-20: qty 10

## 2021-03-20 MED ORDER — ALUM & MAG HYDROXIDE-SIMETH 200-200-20 MG/5ML PO SUSP: 15.0000 mL | ORAL | Status: DC | PRN

## 2021-03-20 MED ORDER — IODIXANOL 320 MG/ML IV SOLN
INTRAVENOUS | Status: DC | PRN
  Administered 2021-03-20: 20 mL via INTRA_ARTERIAL

## 2021-03-20 MED ORDER — LIDOCAINE 2% (20 MG/ML) 5 ML SYRINGE
INTRAMUSCULAR | Status: DC | PRN
  Administered 2021-03-20: 10 mg via INTRAVENOUS

## 2021-03-20 MED ORDER — LORATADINE 10 MG PO TABS
10.0000 mg | ORAL_TABLET | Freq: Every day | ORAL | Status: DC
  Administered 2021-03-21: 10 mg via ORAL
  Filled 2021-03-20: qty 1

## 2021-03-20 MED ORDER — PHENYLEPHRINE 40 MCG/ML (10ML) SYRINGE FOR IV PUSH (FOR BLOOD PRESSURE SUPPORT)
PREFILLED_SYRINGE | INTRAVENOUS | Status: DC | PRN
  Administered 2021-03-20 (×2): 40 ug via INTRAVENOUS
  Administered 2021-03-20: 80 ug via INTRAVENOUS
  Administered 2021-03-20 (×2): 20 ug via INTRAVENOUS

## 2021-03-20 MED ORDER — HEPARIN 6000 UNIT IRRIGATION SOLUTION
Status: DC | PRN
  Administered 2021-03-20: 1

## 2021-03-20 MED ORDER — SODIUM CHLORIDE 0.9 % IV SOLN: 500.0000 mL | Freq: Once | INTRAVENOUS | Status: DC | PRN

## 2021-03-20 MED ORDER — HEPARIN SODIUM (PORCINE) 5000 UNIT/ML IJ SOLN
5000.0000 [IU] | Freq: Three times a day (TID) | INTRAMUSCULAR | Status: DC
  Administered 2021-03-21: 5000 [IU] via SUBCUTANEOUS
  Filled 2021-03-20: qty 1

## 2021-03-20 MED ORDER — ACETAMINOPHEN 650 MG RE SUPP: 325.0000 mg | RECTAL | Status: DC | PRN

## 2021-03-20 MED ORDER — DEXAMETHASONE SODIUM PHOSPHATE 10 MG/ML IJ SOLN
INTRAMUSCULAR | Status: DC | PRN
  Administered 2021-03-20: 10 mg via INTRAVENOUS

## 2021-03-20 MED ORDER — HYDRALAZINE HCL 20 MG/ML IJ SOLN: 5.0000 mg | INTRAMUSCULAR | Status: DC | PRN

## 2021-03-20 MED ORDER — ALPRAZOLAM 0.25 MG PO TABS
0.2500 mg | ORAL_TABLET | Freq: Four times a day (QID) | ORAL | Status: DC | PRN
  Administered 2021-03-20: 0.25 mg via ORAL
  Filled 2021-03-20: qty 1

## 2021-03-20 MED ORDER — ORAL CARE MOUTH RINSE: 15.0000 mL | Freq: Once | OROMUCOSAL | Status: AC

## 2021-03-20 SURGICAL SUPPLY — 63 items
ADH SKN CLS APL DERMABOND .7 (GAUZE/BANDAGES/DRESSINGS) ×4
BAG BANDED W/RUBBER/TAPE 36X54 (MISCELLANEOUS) ×3 IMPLANT
BAG COUNTER SPONGE SURGICOUNT (BAG) ×3 IMPLANT
BAG EQP BAND 135X91 W/RBR TAPE (MISCELLANEOUS) ×2
BAG SPNG CNTER NS LX DISP (BAG) ×2
BALLN STERLING RX 6X30X80 (BALLOONS) ×3
BALLOON STERLING RX 6X30X80 (BALLOONS) IMPLANT
CANISTER SUCT 3000ML PPV (MISCELLANEOUS) ×3 IMPLANT
CATH ROBINSON RED A/P 18FR (CATHETERS) IMPLANT
CLIP VESOCCLUDE MED 6/CT (CLIP) ×3 IMPLANT
CLIP VESOCCLUDE SM WIDE 6/CT (CLIP) ×3 IMPLANT
COVER DOME SNAP 22 D (MISCELLANEOUS) ×3 IMPLANT
COVER PROBE W GEL 5X96 (DRAPES) ×3 IMPLANT
DERMABOND ADVANCED (GAUZE/BANDAGES/DRESSINGS) ×2
DERMABOND ADVANCED .7 DNX12 (GAUZE/BANDAGES/DRESSINGS) ×2 IMPLANT
DRAPE FEMORAL ANGIO 80X135IN (DRAPES) ×3 IMPLANT
ELECT REM PT RETURN 9FT ADLT (ELECTROSURGICAL) ×3
ELECTRODE REM PT RTRN 9FT ADLT (ELECTROSURGICAL) ×2 IMPLANT
GLOVE SURG ENC MOIS LTX SZ7.5 (GLOVE) ×3 IMPLANT
GLOVE SURG MICRO LTX SZ6.5 (GLOVE) ×1 IMPLANT
GOWN STRL REUS W/ TWL LRG LVL3 (GOWN DISPOSABLE) ×4 IMPLANT
GOWN STRL REUS W/ TWL XL LVL3 (GOWN DISPOSABLE) ×2 IMPLANT
GOWN STRL REUS W/TWL LRG LVL3 (GOWN DISPOSABLE) ×9
GOWN STRL REUS W/TWL XL LVL3 (GOWN DISPOSABLE) ×3
HEMOSTAT SNOW SURGICEL 2X4 (HEMOSTASIS) ×1 IMPLANT
INTRODUCER KIT GALT 7CM (INTRODUCER) ×3
KIT BASIN OR (CUSTOM PROCEDURE TRAY) ×3 IMPLANT
KIT ENCORE 26 ADVANTAGE (KITS) ×3 IMPLANT
KIT INTRODUCER GALT 7 (INTRODUCER) ×2 IMPLANT
KIT TURNOVER KIT B (KITS) ×3 IMPLANT
LOOP VESSEL MAXI BLUE (MISCELLANEOUS) ×1 IMPLANT
MARKER SKIN DUAL TIP RULER LAB (MISCELLANEOUS) ×1 IMPLANT
NDL HYPO 25GX1X1/2 BEV (NEEDLE) IMPLANT
NEEDLE HYPO 25GX1X1/2 BEV (NEEDLE) IMPLANT
PACK CAROTID (CUSTOM PROCEDURE TRAY) ×3 IMPLANT
POSITIONER HEAD DONUT 9IN (MISCELLANEOUS) ×3 IMPLANT
PROTECTION STATION PRESSURIZED (MISCELLANEOUS) ×3
SET MICROPUNCTURE 5F STIFF (MISCELLANEOUS) ×3 IMPLANT
SHEATH PINNACLE 5FR (SHEATH) IMPLANT
SHUNT CAROTID BYPASS 10 (VASCULAR PRODUCTS) IMPLANT
SHUNT CAROTID BYPASS 12FRX15.5 (VASCULAR PRODUCTS) IMPLANT
STATION PROTECTION PRESSURIZED (MISCELLANEOUS) ×2 IMPLANT
STENT TRANSCAROTID SYS 10X30 (Permanent Stent) ×1 IMPLANT
STENT TRANSCAROTID SYS 10X40 (Permanent Stent) ×1 IMPLANT
SUT MNCRL AB 4-0 PS2 18 (SUTURE) ×3 IMPLANT
SUT PROLENE 5 0 C 1 24 (SUTURE) ×3 IMPLANT
SUT PROLENE 6 0 BV (SUTURE) IMPLANT
SUT PROLENE 7 0 BV 1 (SUTURE) IMPLANT
SUT SILK 2 0 PERMA HAND 18 BK (SUTURE) IMPLANT
SUT SILK 2 0 SH CR/8 (SUTURE) ×2 IMPLANT
SUT SILK 3 0 (SUTURE)
SUT SILK 3-0 18XBRD TIE 12 (SUTURE) IMPLANT
SUT VIC AB 3-0 SH 27 (SUTURE) ×3
SUT VIC AB 3-0 SH 27X BRD (SUTURE) ×2 IMPLANT
SYR 10ML LL (SYRINGE) ×10 IMPLANT
SYR 20ML LL LF (SYRINGE) ×3 IMPLANT
SYR CONTROL 10ML LL (SYRINGE) IMPLANT
SYSTEM TRANSCAROTID NEUROPRTCT (MISCELLANEOUS) ×2 IMPLANT
TOWEL GREEN STERILE (TOWEL DISPOSABLE) ×3 IMPLANT
TRANSCAROTID NEUROPROTECT SYS (MISCELLANEOUS) ×3
WATER STERILE IRR 1000ML POUR (IV SOLUTION) ×3 IMPLANT
WIRE BENTSON .035X145CM (WIRE) ×3 IMPLANT
WIRE TORQFLEX AUST .018X40CM (WIRE) ×1 IMPLANT

## 2021-03-20 NOTE — Discharge Instructions (Signed)
   [facility]  Discharge Instructions   Carotid Endarterectomy (CEA)  Please refer to the following instructions for your post-procedure care. Your surgeon or physician assistant will discuss any changes with you.  Activity  You are encouraged to walk as much as you can. You can slowly return to normal activities but must avoid strenuous activity and heavy lifting until your doctor tell you it's OK. Avoid activities such as vacuuming or swinging a golf club. You can drive after one week if you are comfortable and you are no longer taking prescription pain medications. It is normal to feel tired for serval weeks after your surgery. It is also normal to have difficulty with sleep habits, eating, and bowel movements after surgery. These will go away with time.  Bathing/Showering  You may shower after you come home. Do not soak in a bathtub, hot tub, or swim until the incision heals completely.  Incision Care  Shower every day. Clean your incision with mild soap and water. Pat the area dry with a clean towel. You do not need a bandage unless otherwise instructed. Do not apply any ointments or creams to your incision. You may have skin glue on your incision. Do not peel it off. It will come off on its own in about one week. Your incision may feel thickened and raised for several weeks after your surgery. This is normal and the skin will soften over time. For Men Only: It's OK to shave around the incision but do not shave the incision itself for 2 weeks. It is common to have numbness under your chin that could last for several months.  Diet  Resume your normal diet. There are no special food restrictions following this procedure. A low fat/low cholesterol diet is recommended for all patients with vascular disease. In order to heal from your surgery, it is CRITICAL to get adequate nutrition. Your body requires vitamins, minerals, and protein. Vegetables are the best  source of vitamins and minerals. Vegetables also provide the perfect balance of protein. Processed food has little nutritional value, so try to avoid this.        Medications  Resume taking all of your medications unless your doctor or physician assistant tells you not to. If your incision is causing pain, you may take over-the- counter pain relievers such as acetaminophen (Tylenol). If you were prescribed a stronger pain medication, please be aware these medications can cause nausea and constipation. Prevent nausea by taking the medication with a snack or meal. Avoid constipation by drinking plenty of fluids and eating foods with a high amount of fiber, such as fruits, vegetables, and grains. Do not take Tylenol if you are taking prescription pain medications.  Follow Up  Our office will schedule a follow up appointment 2-3 weeks following discharge.  Please call us immediately for any of the following conditions  Increased pain, redness, drainage (pus) from your incision site. Fever of 101 degrees or higher. If you should develop stroke (slurred speech, difficulty swallowing, weakness on one side of your body, loss of vision) you should call 911 and go to the nearest emergency room.  Reduce your risk of vascular disease:  Stop smoking. If you would like help call [facility] at 1-800-QUIT-NOW (1-800-784-8669) or [place] at [phone]. Manage your cholesterol Maintain a desired weight Control your diabetes Keep your blood pressure down  If you have any questions, please call the office at [phone].   

## 2021-03-20 NOTE — H&P (Signed)
History and Physical Interval Note:  03/20/2021 8:00 AM  Jenna Lowe  has presented today for surgery, with the diagnosis of Carotid Stenosis.  The various methods of treatment have been discussed with the patient and family. After consideration of risks, benefits and other options for treatment, the patient has consented to  Procedure(s): LEFT TRANSCAROTID ARTERY REVASCULARIZATION (Left) as a surgical intervention.  The patient's history has been reviewed, patient examined, no change in status, stable for surgery.  I have reviewed the patient's chart and labs.  Questions were answered to the patient's satisfaction.    Left TCAR.  Risks and benefits again discussed including risk of perioperative stroke.  High grade stenosis distal CCA after previous endarterectomy.    Marty Heck  Patient name: Jenna Lowe         MRN: [id number]        DOB: 05-12-1946            Sex: female   REASON FOR VISIT: Follow-up after CTA neck to evaluate left common carotid artery stenosis   HPI: Jenna Lowe is a 75 y.o. female with history of breast cancer as well as hypertension and hyperlipidemia that presents to discuss left common carotid artery high-grade stenosis after remote left carotid endarterectomy.  Patient was previously under the care of Dr. Donnetta Hutching.  She had a left carotid endarterectomy in 2019 for an asymptomatic high-grade stenosis.  She was recently seen in follow-up by the PA and was noted to have high-grade stenosis in the left distal common carotid artery.  She was sent for CTA and is here to see me today.  She denies any history of strokes or TIAs.  She is on aspirin.  States trouble with statins in the past due to myalgia.       Past Medical History:  Diagnosis Date   Breast cancer ([place]) 2018    Left breast   Breast cancer of upper-outer quadrant of left female breast ([place])     H/O seasonal allergies     History of kidney stones     Hyperlipidemia      Hypertension     Personal history of radiation therapy             Past Surgical History:  Procedure Laterality Date   BREAST BIOPSY Right 2018    benign   BREAST LUMPECTOMY Left 07/22/2016   BREAST LUMPECTOMY WITH RADIOACTIVE SEED AND SENTINEL LYMPH NODE BIOPSY Left 07/22/2016    Procedure: BREAST LUMPECTOMY WITH RADIOACTIVE SEED AND SENTINEL LYMPH NODE BIOPSY;  Surgeon: Excell Seltzer, MD;  Location: [facility];  Service: General;  Laterality: Left;   ENDARTERECTOMY Left 08/03/2017    Procedure: ENDARTERECTOMY CAROTID LEFT;  Surgeon: Rosetta Posner, MD;  Location: [facility] OR;  Service: Vascular;  Laterality: Left;   NO PAST SURGERIES               Family History  Problem Relation Age of Onset   Lung cancer Sister     Heart disease Sister     Hypertension Sister     Cancer Sister     Stroke Sister     COPD Sister     Kidney disease Mother     Heart disease Father        SOCIAL HISTORY: Social History         Tobacco Use   Smoking status: Every Day      Packs/day: 1.00  Years: 30.00      Pack years: 30.00      Types: Cigarettes   Smokeless tobacco: Never  Substance Use Topics   Alcohol use: No           Allergies  Allergen Reactions   Pseudoephedrine Other (See Comments)      Insomnia   Sulfa Antibiotics Itching   Arimidex [Anastrozole] Other (See Comments)      Night Sweats   Amoxicillin-Pot Clavulanate Diarrhea            Current Outpatient Medications  Medication Sig Dispense Refill   ALPRAZolam (XANAX) 0.25 MG tablet Take 0.25 mg by mouth every 6 (six) hours as needed.       aspirin 81 MG tablet Take 81 mg by mouth daily.       cetirizine (ZYRTEC ALLERGY) 10 MG tablet Take 1 tablet (10 mg total) by mouth daily.       cholecalciferol (VITAMIN D) 25 MCG (1000 UNIT) tablet 1 capsule       fluticasone (FLONASE) 50 MCG/ACT nasal spray Place into both nostrils daily.       hydrochlorothiazide (MICROZIDE) 12.5 MG capsule Take 1 capsule  (12.5 mg total) by mouth daily.       ibuprofen (ADVIL,MOTRIN) 200 MG tablet Take 200 mg by mouth every 8 (eight) hours as needed for mild pain.       lisinopril (PRINIVIL,ZESTRIL) 20 MG tablet Take 20 mg by mouth daily.       Multiple Vitamins-Minerals (MULTIVITAMIN WITH MINERALS) tablet Take 1 tablet by mouth daily.       pantoprazole (PROTONIX) 40 MG tablet Take 40 mg by mouth daily.        No current facility-administered medications for this visit.      REVIEW OF SYSTEMS:  [X]  denotes positive finding, [ ]  denotes negative finding Cardiac   Comments:  Chest pain or chest pressure:      Shortness of breath upon exertion:      Short of breath when lying flat:      Irregular heart rhythm:             Vascular      Pain in calf, thigh, or hip brought on by ambulation:      Pain in feet at night that wakes you up from your sleep:       Blood clot in your veins:      Leg swelling:              Pulmonary      Oxygen at home:      Productive cough:       Wheezing:              Neurologic      Sudden weakness in arms or legs:       Sudden numbness in arms or legs:       Sudden onset of difficulty speaking or slurred speech:      Temporary loss of vision in one eye:       Problems with dizziness:              Gastrointestinal      Blood in stool:       Vomited blood:              Genitourinary      Burning when urinating:       Blood in urine:  Psychiatric      Major depression:              Hematologic      Bleeding problems:      Problems with blood clotting too easily:             Skin      Rashes or ulcers:             Constitutional      Fever or chills:          PHYSICAL EXAM:     Vitals:    02/26/21 0926 02/26/21 0932  BP: (!) 157/94 (!) 156/77  Pulse: 70 68  Resp: 14    Temp: 97.6 F (36.4 C)    TempSrc: Temporal    SpO2: 95%    Weight: 125 lb (56.7 kg)    Height: 5\' 3"  (1.6 m)        GENERAL: The patient is a well-nourished  female, in no acute distress. The vital signs are documented above. CARDIAC: There is a regular rate and rhythm.  VASCULAR:  Palpable radial pulses bilaterally PULMONARY: No respiratory distress. ABDOMEN: Soft and non-tender. MUSCULOSKELETAL: There are no major deformities or cyanosis. NEUROLOGIC: No focal weakness or paresthesias are detected.  CN II-XII grossly intact. SKIN: There are no ulcers or rashes noted. PSYCHIATRIC: The patient has a normal affect.   DATA:    Carotid duplex 02/12/2021 shows high-grade stenosis in the distal left common carotid with a velocity 530/195 consistent with high-grade stenosis 80-99%   CTA neck on my review shows a high-grade stenosis with soft plaque in the distal left common carotid just before the patch   Assessment/Plan:   75 year old female presents with high-grade common carotid stenosis in the distal left common carotid artery just before the patch.  This is in the setting of previous left carotid endarterectomy in 2019 by Dr. Donnetta Hutching for an asymptomatic high-grade stenosis.  I have recommended a left TCAR with flow reversal with angioplasty and stent placement.  We discussed this being done in the operating room for stroke risk reduction.  I have started her on Plavix as well as a simvastatin.  She is going to the beach in early [place] so we will schedule her surgery for the week she returns.  Discussed 1% risk of perioperative stroke as well as other risks.  We will get her scheduled today.     Marty Heck, MD [facility] Office: [phone]

## 2021-03-20 NOTE — Plan of Care (Signed)
Pt arrived on the unit, A/O, RA, VS stable, Sites are clean, dry and intact. CHG done. Family is aware. Bed in low position, alarms are on, call bell in reach

## 2021-03-20 NOTE — Op Note (Signed)
Date: March 20, 2021  Preoperative diagnosis: Recurrent high-grade stenosis in the left distal common carotid artery in the setting of remote left carotid endarterectomy with significant soft plaque  Postoperative diagnosis: Same  Procedure: 1.  Ultrasound-guided access of right common femoral vein for delivery of TCAR venous flow reversal sheath 2.  Transcatheter placement of intravascular stent in the left cervical carotid artery using percutaneous access after cutdown including angioplasty with distal embolic protection (left TCAR)  Surgeon: Dr. Marty Heck, MD  Assistant: Risa Grill, PA  Indications: Patient is a 75 year old female that previously underwent a left carotid endarterectomy by my partner Dr. Donnetta Hutching in 2019.  She was recently seen in surveillance and found to have a high-grade stenosis in the distal common carotid artery at the proximal patch site.  She presents today for angioplasty and stenting of the lesion with distal embolic protection using a left TCAR technique.  Risk benefits discussed.  An assistant was needed for exposure and to expedite the case.  Findings: The right common femoral vein was accessed under ultrasound guidance and venous flow reversal sheath was placed.  The left common carotid artery was then accessed with percutaneous access just above the clavicle after a cutdown with a transverse incision.  Once our sheath was in place we actually proceeded by clamping the common carotid first after we had a therapeutic ACT given soft plaque and short runway.  I then was able to place my J-wire just below the lesion and get my arterial sheath in place.  We then established active flow reversal.  The lesion was crossed.  The lesion was predilated with a 6 mm x 30 mm angioplasty balloon and then stented with a 10 mm x 40 mm and 10 mm x 30 mm Enroute stent.  This required two stents in order to ensure we were able to land the stents not in the middle of the  patch where the stent would have no wall apposition.  Excellent results with no residual stenosis.  Anesthesia: General  Details: Patient was taken to the operating room after informed consent was obtained.  Placed on operative table in supine position.  General endotracheal anesthesia induced.  Antibiotics were given.  Preop timeout was performed.  Initially evaluated the right common femoral vein with [place] that was patent and image was saved.  We then accessed this with a micro access needle placed a microwire and then a micro sheath then a Bentson wire and then the TCAR venous flow reversal sheath.  This flushed and aspirated easily with venous backbleeding.  Then turned attention to the left neck where a transverse incision was made 1 fingerbreadth above the left clavicle.  Dissected down through the subcutaneous tissue and platysma.  I used small wheatlander retractors.  We divided the heads of the sternocleidomastoid in the avascular plane and the internal jugular vein and vagus nerve were mobilized lateral.  The common carotid artery was then dissected free in the wound and I placed a vessel loop and umbilical tape around this.  Patient was given 100 units/kg IV heparin.  We checked an ACT to maintain greater than 250.  5-0 Prolene was then used to place a pursestring in the anterior wall the vessel.  I then accessed this with a micro access needle and placed a microwire and then a micro sheath.  We got a carotid angiogram and identified the high-grade stenosis in the proximal patch of the left carotid artery.  We had a fairly short  run way so we elected to preclamp with our vessel after we had a therapeutic ACT above 250 and we pushed up the blood pressure above 160 mmHg.  I then placed the J-wire below the lesion and placed the arterial sheath.  We then established active flow reversal by connecting the filter from the arterial sheath in the common carotid to the venous sheath in the groin.  We checked  and had good active flow reversal with distal embolic protection.  The lesion was then crossed with a wire after we established another arteriogram for roadmap.  We pre-dilated the lesion with a 6 mm x 30 mm angioplasty balloon and then stented this with a 10 mm x 40 mm and 10 mm x 30 mm Enroute stent landing in the internal carotid artery and then stenting all through the patch to the proximal lesion in the distal common carotid artery.  Excellent results with no residual stenosis.  We allowed 3 minutes of active flow reversal.  That point in time we took two additional images in multiple views and there is no dissection with widely patent stent and no residual stenosis.  Wire was removed.  We then removed the filter and passed it off the field.  The arterial sheath was removed and we tied down the pursestring with good hemostasis.  We checked with a pencil Doppler and had good flow in the left carotid artery.  Protamine was given for reversal.  The venous sheath was removed and pressure was held in the groin with good hemostasis.  We washed out the neck and used Surgicel snow for hemostasis.  This was closed with 3-0 Vicryl 4-0 Monocryl and Dermabond.  Awakened from anesthesia with no deficits.    Complication: None  Condition: Stable  Marty Heck, MD [facility] Office: [place]

## 2021-03-20 NOTE — Anesthesia Postprocedure Evaluation (Signed)
Anesthesia Post Note  Patient: Jenna Lowe  Procedure(s) Performed: LEFT TRANSCAROTID ARTERY REVASCULARIZATION (Left: Neck) ULTRASOUND GUIDANCE FOR VASCULAR ACCESS     Patient location during evaluation: PACU Anesthesia Type: General Level of consciousness: awake and alert, patient cooperative and oriented Pain management: pain level controlled Vital Signs Assessment: post-procedure vital signs reviewed and stable Respiratory status: spontaneous breathing, nonlabored ventilation and respiratory function stable Cardiovascular status: blood pressure returned to baseline and stable Postop Assessment: no apparent nausea or vomiting Anesthetic complications: no   No notable events documented.  Last Vitals:  Vitals:   03/20/21 1230 03/20/21 1245  BP: (!) 110/58 (!) 98/52  Pulse: 83 76  Resp: 11 12  Temp:    SpO2: 92% 92%    Last Pain:  Vitals:   03/20/21 1215  TempSrc:   PainSc: Asleep                 [name],E. Ursula Dermody

## 2021-03-20 NOTE — Transfer of Care (Signed)
Immediate Anesthesia Transfer of Care Note  Patient: Jenna Lowe  Procedure(s) Performed: LEFT TRANSCAROTID ARTERY REVASCULARIZATION (Left: Neck) ULTRASOUND GUIDANCE FOR VASCULAR ACCESS  Patient Location: PACU  Anesthesia Type:General  Level of Consciousness: awake and patient cooperative  Airway & Oxygen Therapy: Patient Spontanous Breathing and Patient connected to face mask oxygen  Post-op Assessment: Report given to RN and Post -op Vital signs reviewed and stable  Post vital signs: Reviewed and stable  Last Vitals:  Vitals Value Taken Time  BP 129/74 03/20/21 1030  Temp    Pulse 94 03/20/21 1033  Resp 16 03/20/21 1033  SpO2 100 % 03/20/21 1033  Vitals shown include unvalidated device data.  Last Pain:  Vitals:   03/20/21 0725  TempSrc:   PainSc: 0-No pain         Complications: No notable events documented.

## 2021-03-21 ENCOUNTER — Encounter (HOSPITAL_COMMUNITY): Payer: Self-pay | Admitting: Vascular Surgery

## 2021-03-21 LAB — LIPID PANEL
Cholesterol: 158 mg/dL (ref 0–200)
HDL: 41 mg/dL (ref >40)
LDL Cholesterol: 88 mg/dL (ref 0–99)
Total CHOL/HDL Ratio: 3.9 RATIO
Triglycerides: 146 mg/dL (ref <150)
VLDL: 29 mg/dL (ref 0–40)

## 2021-03-21 LAB — BASIC METABOLIC PANEL
Anion gap: 8 (ref 5–15)
BUN: 14 mg/dL (ref 8–23)
CO2: 25 mmol/L (ref 22–32)
Calcium: 9 mg/dL (ref 8.9–10.3)
Chloride: 105 mmol/L (ref 98–111)
Creatinine, Ser: 1.07 mg/dL — ABNORMAL HIGH (ref 0.44–1.00)
GFR, Estimated: 53 mL/min — ABNORMAL LOW (ref >60)
Glucose, Bld: 132 mg/dL — ABNORMAL HIGH (ref 70–99)
Potassium: 3.5 mmol/L (ref 3.5–5.1)
Sodium: 138 mmol/L (ref 135–145)

## 2021-03-21 LAB — CBC
HCT: 36.3 % (ref 36.0–46.0)
Hemoglobin: 12 g/dL (ref 12.0–15.0)
MCH: 30.5 pg (ref 26.0–34.0)
MCHC: 33.1 g/dL (ref 30.0–36.0)
MCV: 92.1 fL (ref 80.0–100.0)
Platelets: 231 10*3/uL (ref 150–400)
RBC: 3.94 MIL/uL (ref 3.87–5.11)
RDW: 12.7 % (ref 11.5–15.5)
WBC: 12.3 10*3/uL — ABNORMAL HIGH (ref 4.0–10.5)
nRBC: 0 % (ref 0.0–0.2)

## 2021-03-21 MED ORDER — TRAMADOL HCL 50 MG PO TABS: 50.0000 mg | ORAL_TABLET | Freq: Four times a day (QID) | ORAL | 0 refills | Status: AC | PRN

## 2021-03-21 NOTE — Discharge Summary (Signed)
Carotid Discharge Summary     Jenna Lowe 19-Aug-1945 75 y.o. female  [id number]  Admission Date: 03/20/2021  Discharge Date: 03/21/21  Physician: Marty Heck, MD  Admission Diagnosis: Left carotid artery stenosis [facility] Course:  The patient was admitted to the hospital and taken to the operating room on 03/20/2021 and underwent left transcarotid artery revascularization.  The pt tolerated the procedure well and was transported to the PACU in good condition.   By POD 1, the pt neuro status remained intact. Left neck incision well appearing. Blood pressure soft overnight but asymptomatic.   The remainder of the hospital course consisted of increasing mobilization and increasing intake of solids without difficulty.  She remained stable for discharge home. She will remain on Aspirin, statin, and Plavix. She has follow up arranged in 1 month with Carotid duplex.    Recent Labs    03/21/21 0203  NA 138  K 3.5  CL 105  CO2 25  GLUCOSE 132*  BUN 14  CALCIUM 9.0   Recent Labs    03/20/21 1508 03/21/21 0203  WBC 11.9* 12.3*  HGB 13.0 12.0  HCT 39.3 36.3  PLT 244 231   No results for input(s): INR in the last 72 hours.   Discharge Instructions     Call MD for:  difficulty breathing, headache or visual disturbances   Complete by: As directed    Call MD for:  redness, tenderness, or signs of infection (pain, swelling, redness, odor or green/yellow discharge around incision site)   Complete by: As directed    Call MD for:  severe uncontrolled pain   Complete by: As directed    Call MD for:  temperature >100.4   Complete by: As directed    Diet - low sodium heart healthy   Complete by: As directed    Discharge patient   Complete by: As directed    Discharge disposition: 01-Home or Self Care   Discharge patient date: 03/21/2021   Discharge wound care:   Complete by: As directed    Clean incision with mild soap and water, pat dry    Driving Restrictions   Complete by: As directed    No driving while taking pain medications   Increase activity slowly   Complete by: As directed    Lifting restrictions   Complete by: As directed    No heavy lifting, pushing, pulling > 10 lbs       Discharge Diagnosis:  Left carotid artery stenosis [I65.22]  Secondary Diagnosis: Patient Active Problem List   Diagnosis Date Noted   Left carotid artery stenosis 03/20/2021   Carotid artery stenosis 08/03/2017   Malignant neoplasm of upper-inner quadrant of left breast in female, estrogen receptor positive ([place]) 07/17/2016   Past Medical History:  Diagnosis Date   Breast cancer ([place]) 2018   Left breast   Breast cancer of upper-outer quadrant of left female breast ([place])    Depression 2010   H/O seasonal allergies    History of kidney stones    Hyperlipidemia    Hypertension    Personal history of radiation therapy     Allergies as of 03/21/2021       Reactions   Pseudoephedrine Other (See Comments)   Insomnia   Sulfa Antibiotics Itching   Arimidex [anastrozole] Other (See Comments)   Night Sweats   Levofloxacin    Other reaction(s): palpitations   Pravachol [pravastatin]    Other reaction(s): myalgia   Amoxicillin-pot Clavulanate Diarrhea  Medication List     TAKE these medications    ALPRAZolam 0.25 MG tablet Commonly known as: XANAX Take 0.25 mg by mouth every 6 (six) hours as needed for anxiety.   aspirin 81 MG tablet Take 81 mg by mouth daily.   cetirizine 10 MG tablet Commonly known as: ZyrTEC Allergy Take 1 tablet (10 mg total) by mouth daily.   clopidogrel 75 MG tablet Commonly known as: PLAVIX Take 1 tablet (75 mg total) by mouth daily.   fluticasone 50 MCG/ACT nasal spray Commonly known as: FLONASE Place 1 spray into both nostrils daily.   hydrochlorothiazide 12.5 MG capsule Commonly known as: MICROZIDE Take 1 capsule (12.5 mg total) by mouth daily.   ibuprofen 200 MG  tablet Commonly known as: ADVIL Take 400 mg by mouth every 8 (eight) hours as needed for mild pain or headache.   lisinopril 20 MG tablet Commonly known as: ZESTRIL Take 20 mg by mouth daily.   multivitamin with minerals tablet Take 1 tablet by mouth daily.   pantoprazole 40 MG tablet Commonly known as: PROTONIX Take 40 mg by mouth daily.   PROBIOTIC DAILY PO Take 1 tablet by mouth 2 (two) times a week.   simvastatin 10 MG tablet Commonly known as: ZOCOR Take 1 tablet (10 mg total) by mouth at bedtime.   traMADol 50 MG tablet Commonly known as: Ultram Take 1 tablet (50 mg total) by mouth every 6 (six) hours as needed.   Vitamin D3 50 MCG (2000 UT) Tabs Take 2,000 Units by mouth daily.               Discharge Care Instructions  (From admission, onward)           Start     Ordered   03/21/21 0000  Discharge wound care:       Comments: Clean incision with mild soap and water, pat dry   03/21/21 1058             Discharge Instructions:   [facility] Discharge Instructions Carotid Endarterectomy (CEA)  Please refer to the following instructions for your post-procedure care. Your surgeon or physician assistant will discuss any changes with you.  Activity  You are encouraged to walk as much as you can. You can slowly return to normal activities but must avoid strenuous activity and heavy lifting until your doctor tell you it's OK. Avoid activities such as vacuuming or swinging a golf club. You can drive after one week if you are comfortable and you are no longer taking prescription pain medications. It is normal to feel tired for serval weeks after your surgery. It is also normal to have difficulty with sleep habits, eating, and bowel movements after surgery. These will go away with time.  Bathing/Showering  You may shower after you come home. Do not soak in a bathtub, hot tub, or swim until the incision heals  completely.  Incision Care  Shower every day. Clean your incision with mild soap and water. Pat the area dry with a clean towel. You do not need a bandage unless otherwise instructed. Do not apply any ointments or creams to your incision. You may have skin glue on your incision. Do not peel it off. It will come off on its own in about one week. Your incision may feel thickened and raised for several weeks after your surgery. This is normal and the skin will soften over time. For Men Only: It's OK to shave  around the incision but do not shave the incision itself for 2 weeks. It is common to have numbness under your chin that could last for several months.  Diet  Resume your normal diet. There are no special food restrictions following this procedure. A low fat/low cholesterol diet is recommended for all patients with vascular disease. In order to heal from your surgery, it is CRITICAL to get adequate nutrition. Your body requires vitamins, minerals, and protein. Vegetables are the best source of vitamins and minerals. Vegetables also provide the perfect balance of protein. Processed food has little nutritional value, so try to avoid this.  Medications  Resume taking all of your medications unless your doctor or physician assistant tells you not to.  If your incision is causing pain, you may take over-the- counter pain relievers such as acetaminophen (Tylenol). If you were prescribed a stronger pain medication, please be aware these medications can cause nausea and constipation.  Prevent nausea by taking the medication with a snack or meal. Avoid constipation by drinking plenty of fluids and eating foods with a high amount of fiber, such as fruits, vegetables, and grains. Do not take Tylenol if you are taking prescription pain medications.  Follow Up  Our office will schedule a follow up appointment 2-3 weeks following discharge.  Please call us immediately for any of the following  conditions  Increased pain, redness, drainage (pus) from your incision site. Fever of 101 degrees or higher. If you should develop stroke (slurred speech, difficulty swallowing, weakness on one side of your body, loss of vision) you should call 911 and go to the nearest emergency room.  Reduce your risk of vascular disease:  Stop smoking. If you would like help call [facility] at 1-800-QUIT-NOW (929) 662-4887) or [place] at (623) 291-3962. Manage your cholesterol Maintain a desired weight Control your diabetes Keep your blood pressure down  If you have any questions, please call the office at [phone].  Prescriptions given: Tramadol 50 mg 1 Tablet every 6 hours as needed for severe pain #8 No Refill  Disposition: Home  Patient's condition: is Good  Follow up: 1. Dr. Carlis Abbott in 2 weeks.   Aelyn Stanaland PA-C [facility] [phone]   --- For [facility] Registry use ---   Modified Rankin score at D/C (0-6): 0  IV medication needed for:  1. Hypertension: No 2. Hypotension: No  Post-op Complications: No  1. Post-op CVA or TIA: No  If yes: Event classification (right eye, left eye, right cortical, left cortical, verterobasilar, other): n/a  If yes: Timing of event (intra-op, <6 hrs post-op, >=6 hrs post-op, unknown): n/a  2. CN injury: No  If yes: CN not injuried   3. Myocardial infarction: No  If yes: Dx by (EKG or clinical, Troponin): n/a  4.  CHF: No  5.  Dysrhythmia (new): No  6. Wound infection: No  7. Reperfusion symptoms: No  8. Return to OR: No  If yes: return to OR for (bleeding, neurologic, other CEA incision, other): n/a  Discharge medications: Statin use:  Yes ASA use:  Yes   Beta blocker use:  No ACE-Inhibitor use:  Yes  ARB use:  No CCB use: No P2Y12 Antagonist use: Yes, [ ]  Plavix, [ ]  Plasugrel, [ ]  Ticlopinine, [ ]  Ticagrelor, [ ]  Other, [ ]  No for medical reason, [ ]  Non-compliant, [ ]   Not-indicated Anti-coagulant use:  No, [ ]  Warfarin, [ ]  Rivaroxaban, [ ]  Dabigatran,

## 2021-03-21 NOTE — Progress Notes (Signed)
Mobility Specialist Progress Note:   03/21/21 1047  Mobility  Activity Ambulated in hall  Level of Assistance Standby assist, set-up cues, supervision of patient - no hands on  Assistive Device None  Distance Ambulated (ft) 525 ft  Mobility Ambulated with assistance in hallway  Mobility Response Tolerated well  Mobility performed by Mobility specialist  Bed Position Chair  $Mobility charge 1 Mobility   Pre- Mobility: 92 HR During Mobility:96 HR Post Mobility:  90 HR  Pt received in bed willing to participate in mobility. No complaints of pain and asymptomatic throughout walk. Pt returned to chair wit call bell in reach and all needs met.   [facility] Health and safety inspector Phone [phone]

## 2021-03-21 NOTE — Plan of Care (Signed)
°  Problem: Education: °Goal: Understanding of CV disease, CV risk reduction, and recovery process will improve °Outcome: Adequate for Discharge °Goal: Individualized Educational Video(s) °Outcome: Adequate for Discharge °  °

## 2021-03-21 NOTE — Progress Notes (Signed)
PHARMACIST LIPID MONITORING   Jenna Lowe is a 75 y.o. female admitted on 03/20/2021 for left TCAR.  Pharmacy has been consulted to optimize lipid-lowering therapy with the indication of secondary prevention for clinical ASCVD.  Recent Labs:  Lipid Panel (last 6 months):   Lab Results  Component Value Date   CHOL 158 03/21/2021   TRIG 146 03/21/2021   HDL 41 03/21/2021   CHOLHDL 3.9 03/21/2021   VLDL 29 03/21/2021   LDLCALC 88 03/21/2021    Hepatic function panel (last 6 months):   Lab Results  Component Value Date   AST 29 03/07/2021   ALT 18 03/07/2021   ALKPHOS 89 03/07/2021   BILITOT 0.7 03/07/2021    SCr (since admission):   Serum creatinine: 1.07 mg/dL (H) 03/21/21 0203 Estimated creatinine clearance: 36.4 mL/min (A)  Current therapy and lipid therapy tolerance Current lipid-lowering therapy: simvastatin 10mg  Previous lipid-lowering therapies (if applicable): pravastatin Documented or reported allergies or intolerances to lipid-lowering therapies (if applicable): myalgias  Assessment:   Patient prefers no changes in lipid-lowering therapy at this time due to just starting this medication and having adverse effects with previous statin medication.   Plan:    1.Statin intensity (high intensity recommended for all patients regardless of the LDL):  Statin intolerance noted. No statin changes due to serious side effects (ex. Myalgias with at least 2 different statins).  2.Add ezetimibe (if any one of the following):   Not indicated at this time.  3.Refer to lipid clinic:   No  4.Follow-up with:  Primary care provider - Prince Solian, MD  5.Follow-up labs after discharge:  No changes in lipid therapy, repeat a lipid panel in one year.     Erin Hearing PharmD., BCPS Clinical Pharmacist 03/21/2021 10:13 AM

## 2021-03-21 NOTE — Progress Notes (Addendum)
  Progress Note    03/21/2021 8:00 AM 1 Day Post-Op  Subjective:  no complaints   Vitals:   03/20/21 2315 03/21/21 0508  BP: 90/60 (!) 92/56  Pulse: 77 80  Resp: 19 16  Temp: 98.2 F (36.8 C) 98 F (36.7 C)  SpO2: 93% 95%   Physical Exam: Cardiac:  regular Lungs:  non labored Incisions:  left neck incision is clean, dry and intact. Mild ecchymosis present. Soft, without swelling or hematoma Extremities: moving all extremities without deficits Neurologic: CN intact. Smile symmetric. Speech coherent. Tongue midline. Sensation is intact and equal bilaterally  CBC    Component Value Date/Time   WBC 12.3 (H) 03/21/2021 0203   RBC 3.94 03/21/2021 0203   HGB 12.0 03/21/2021 0203   HCT 36.3 03/21/2021 0203   PLT 231 03/21/2021 0203   MCV 92.1 03/21/2021 0203   MCH 30.5 03/21/2021 0203   MCHC 33.1 03/21/2021 0203   RDW 12.7 03/21/2021 0203   LYMPHSABS 2.4 01/02/2021 1851   MONOABS 0.7 01/02/2021 1851   EOSABS 0.1 01/02/2021 1851   BASOSABS 0.1 01/02/2021 1851    BMET    Component Value Date/Time   NA 138 03/21/2021 0203   K 3.5 03/21/2021 0203   CL 105 03/21/2021 0203   CO2 25 03/21/2021 0203   GLUCOSE 132 (H) 03/21/2021 0203   BUN 14 03/21/2021 0203   CREATININE 1.07 (H) 03/21/2021 0203   CALCIUM 9.0 03/21/2021 0203   GFRNONAA 53 (L) 03/21/2021 0203   GFRAA >60 08/04/2017 0407    INR    Component Value Date/Time   INR 0.9 03/07/2021 1530     Intake/Output Summary (Last 24 hours) at 03/21/2021 0800 Last data filed at 03/20/2021 1856 Gross per 24 hour  Intake 1250 ml  Output 315 ml  Net 935 ml     Assessment/Plan:  75 y.o. female is s/p Left TCAR 1 Day Post-Op   Doing well post op. Left neck incision looks good Femoral access site without swelling or hematoma Neurologically intact Blood pressure has been soft however taking BP in her leg Have asked nurses to move cuff back to RUE now that a line out Ambulating without difficulty Tolerating  diet Will monitor her this morning and hopefully she will remain stable for discharge this afternoon She will continue her Aspirin, statin and Plavix She will have follow up in 1 month with Carotid Duplex   Karoline Caldwell, PA-C [facility] [phone] 03/21/2021 8:00 AM  I have seen and evaluated the patient. I agree with the PA note as documented above.  Postop day 1 status post left TCAR for recurrent high grade stenosis in the distal CCA/proximal patch after previous endarterectomy by Dr. Donnetta Hutching in 2019.  She underwent angioplasty with 2 stents yesterday with excellent results.  Neuro intact this morning.  Neck incision looks good.  Plan discharge today on aspirin statin Plavix as long as she does okay this morning ambulating and tolerating breakfast etc.  Discussed we will arrange follow-up in 1 month with carotid duplex.  Marty Heck, MD [facility] [place] Office: [phone]

## 2021-03-21 NOTE — Progress Notes (Signed)
Discharge instructions (including medications) discussed with and copy provided to patient/caregiver 

## 2021-03-29 ENCOUNTER — Encounter (HOSPITAL_COMMUNITY): Payer: Self-pay | Admitting: Vascular Surgery

## 2021-04-05 ENCOUNTER — Other Ambulatory Visit: Payer: Self-pay

## 2021-04-05 DIAGNOSIS — I6523 Occlusion and stenosis of bilateral carotid arteries: Secondary | ICD-10-CM

## 2021-04-16 ENCOUNTER — Ambulatory Visit (INDEPENDENT_AMBULATORY_CARE_PROVIDER_SITE_OTHER): Payer: Medicare Other | Admitting: Physician Assistant

## 2021-04-16 ENCOUNTER — Ambulatory Visit (HOSPITAL_COMMUNITY)
Admission: RE | Admit: 2021-04-16 | Discharge: 2021-04-16 | Disposition: A | Discharge status: Home / Self Care | Payer: Medicare Other | Source: Ambulatory Visit | Attending: Vascular Surgery | Admitting: Vascular Surgery

## 2021-04-16 ENCOUNTER — Other Ambulatory Visit: Payer: Self-pay

## 2021-04-16 VITALS — BP 134/85 | HR 77 | Temp 97.0°F | Resp 18 | Ht 63.0 in | Wt 124.9 lb

## 2021-04-16 DIAGNOSIS — I6523 Occlusion and stenosis of bilateral carotid arteries: Secondary | ICD-10-CM | POA: Insufficient documentation

## 2021-04-16 NOTE — Progress Notes (Signed)
POST OPERATIVE OFFICE NOTE    CC:  F/u for surgery  HPI:  This is a 75 y.o. female who is s/p left TCAR by Dr. Carlis Abbott on 03/20/21. This was performed secondary to recurrent high grade stenosis in the distal left CCA. She has remote history of left CEA by Dr. Donnetta Hutching in  March of 2019. She tolerated the TCAR well and remained neurologically intact so she was discharge home post operative day 1.   Since the surgery she reports she has been doing great. She said she has had some numbness/ tingling sensation to tough in her chest and around incision but this is improving/ the area is receeding. She also explains that when she initially went home her whole chest was black and blue but this has resolved. Otherwise she denies any amaurosis fugax or other visual changes, slurred speech, facial drooping, difficulty swallowing, unilateral upper or lower extremity weakness or numbness.  She is on Statin, Aspirin and Plavix. Her hypertension is managed with HCTZ and Lisinopril  Allergies  Allergen Reactions   Pseudoephedrine Other (See Comments)    Insomnia   Sulfa Antibiotics Itching   Arimidex [Anastrozole] Other (See Comments)    Night Sweats   Levofloxacin     Other reaction(s): palpitations   Pravachol [Pravastatin]     Other reaction(s): myalgia   Amoxicillin-Pot Clavulanate Diarrhea    Current Outpatient Medications  Medication Sig Dispense Refill   ALPRAZolam (XANAX) 0.25 MG tablet Take 0.25 mg by mouth every 6 (six) hours as needed for anxiety.     aspirin 81 MG tablet Take 81 mg by mouth daily.     cetirizine (ZYRTEC ALLERGY) 10 MG tablet Take 1 tablet (10 mg total) by mouth daily.     Cholecalciferol (VITAMIN D3) 50 MCG (2000 UT) TABS Take 2,000 Units by mouth daily.     clopidogrel (PLAVIX) 75 MG tablet Take 1 tablet (75 mg total) by mouth daily. 30 tablet 6   fluticasone (FLONASE) 50 MCG/ACT nasal spray Place 1 spray into both nostrils daily.     hydrochlorothiazide (MICROZIDE) 12.5  MG capsule Take 1 capsule (12.5 mg total) by mouth daily.     ibuprofen (ADVIL,MOTRIN) 200 MG tablet Take 400 mg by mouth every 8 (eight) hours as needed for mild pain or headache.     lisinopril (PRINIVIL,ZESTRIL) 20 MG tablet Take 20 mg by mouth daily.     Multiple Vitamins-Minerals (MULTIVITAMIN WITH MINERALS) tablet Take 1 tablet by mouth daily.     pantoprazole (PROTONIX) 40 MG tablet Take 40 mg by mouth daily.     Probiotic Product (PROBIOTIC DAILY PO) Take 1 tablet by mouth 2 (two) times a week.     simvastatin (ZOCOR) 10 MG tablet Take 1 tablet (10 mg total) by mouth at bedtime. 90 tablet 1   traMADol (ULTRAM) 50 MG tablet Take 1 tablet (50 mg total) by mouth every 6 (six) hours as needed. (Patient not taking: Reported on 04/16/2021) 8 tablet 0   No current facility-administered medications for this visit.     ROS:  See HPI  Physical Exam:  Vitals:   04/16/21 0940 04/16/21 0943  BP: 126/74 134/85  Pulse: 77   Resp: 18   Temp: (!) 97 F (36.1 C)   TempSrc: Temporal   SpO2: 100%   Weight: 124 lb 14.4 oz (56.7 kg)   Height: 5\' 3"  (1.6 m)    General: well appearing, well nourished Incision:  left supraclavicular incision intact and healing very  well. No swelling, no erythema Extremities:  moving all extremities without deficits. 2+ radial pulses, 2+ DP bilaterally Neuro: alert and oriented. CN intact  Non invasive vascular lab: 04/16/21 Left Stent(s):CCA-ICA  +---------------+---+--++++  Prox to Stent  10227  +---------------+---+--++++  Proximal Stent 79 23  +---------------+---+--++++  Mid Stent      72 20  +---------------+---+--++++  Distal Stent   57 11  +---------------+---+--++++  Distal to Stent74 23  +---------------+---+--++++   Summary:  Right Carotid: Velocities in the right ICA are consistent with a 1-39% stenosis.   Left Carotid: Velocities in the left ICA are consistent with a 1-39% stenosis.   Vertebrals:   Bilateral vertebral arteries demonstrate antegrade flow.  Subclavians: Normal flow hemodynamics were seen in bilateral subclavian arteries.   Assessment/Plan:  This is a 75 y.o. female who is s/p left TCAR by Dr. Carlis Abbott on 03/20/21. She is doing excellent post op. She has no new neurological symptoms. Her duplex today shows patent left carotid stent. Bilateral ICA stenosis of 1-39%. Normal flow dynamics in the vertebral and subclavian arteries.  - She was advised to take her Plavix for 1 month post stent placement. She can not discontinue taking Plavix and she will continue her Aspirin 81 mg daily - Continue statin and Aspirin - She will follow up in 9 months with repeat carotid duplex - She will follow up sooner if she has any new or concerning symptoms    Karoline Caldwell, PA-C [facility] [phone]  Clinic MD:  Roxanne Mins

## 2021-04-18 ENCOUNTER — Other Ambulatory Visit: Payer: Self-pay

## 2021-04-18 DIAGNOSIS — I6523 Occlusion and stenosis of bilateral carotid arteries: Secondary | ICD-10-CM

## 2021-10-25 ENCOUNTER — Other Ambulatory Visit: Payer: Self-pay

## 2021-10-25 ENCOUNTER — Emergency Department (HOSPITAL_BASED_OUTPATIENT_CLINIC_OR_DEPARTMENT_OTHER): Payer: Medicare Other

## 2021-10-25 ENCOUNTER — Other Ambulatory Visit (HOSPITAL_BASED_OUTPATIENT_CLINIC_OR_DEPARTMENT_OTHER): Payer: Self-pay

## 2021-10-25 ENCOUNTER — Emergency Department (HOSPITAL_BASED_OUTPATIENT_CLINIC_OR_DEPARTMENT_OTHER)
Admission: EM | Admit: 2021-10-25 | Discharge: 2021-10-25 | Disposition: A | Discharge status: Home / Self Care | Payer: Medicare Other | Attending: Emergency Medicine | Admitting: Emergency Medicine

## 2021-10-25 ENCOUNTER — Emergency Department (HOSPITAL_BASED_OUTPATIENT_CLINIC_OR_DEPARTMENT_OTHER): Payer: Medicare Other | Admitting: Radiology

## 2021-10-25 ENCOUNTER — Encounter (HOSPITAL_BASED_OUTPATIENT_CLINIC_OR_DEPARTMENT_OTHER): Payer: Self-pay

## 2021-10-25 DIAGNOSIS — Z7982 Long term (current) use of aspirin: Secondary | ICD-10-CM | POA: Diagnosis not present

## 2021-10-25 DIAGNOSIS — M25552 Pain in left hip: Secondary | ICD-10-CM | POA: Diagnosis present

## 2021-10-25 DIAGNOSIS — Z7902 Long term (current) use of antithrombotics/antiplatelets: Secondary | ICD-10-CM | POA: Diagnosis not present

## 2021-10-25 DIAGNOSIS — S32592A Other specified fracture of left pubis, initial encounter for closed fracture: Secondary | ICD-10-CM | POA: Insufficient documentation

## 2021-10-25 DIAGNOSIS — W109XXA Fall (on) (from) unspecified stairs and steps, initial encounter: Secondary | ICD-10-CM | POA: Diagnosis not present

## 2021-10-25 MED ORDER — OXYCODONE-ACETAMINOPHEN 5-325 MG PO TABS
1.0000 | ORAL_TABLET | Freq: Once | ORAL | Status: AC
  Administered 2021-10-25: 1 via ORAL
  Filled 2021-10-25: qty 1

## 2021-10-25 MED ORDER — OXYCODONE-ACETAMINOPHEN 5-325 MG PO TABS: 1.0000 | ORAL_TABLET | Freq: Four times a day (QID) | ORAL | 0 refills | Status: DC | PRN

## 2021-10-25 MED ORDER — OXYCODONE-ACETAMINOPHEN 5-325 MG PO TABS
1.0000 | ORAL_TABLET | Freq: Four times a day (QID) | ORAL | 0 refills | Status: DC | PRN
  Filled 2021-10-25: qty 12, 3d supply, fill #0

## 2021-10-25 NOTE — ED Notes (Signed)
X-ray at bedside

## 2021-10-25 NOTE — ED Notes (Signed)
Pt transported to CT ?

## 2021-10-25 NOTE — Discharge Instructions (Addendum)
Recommend resting, when mobilizing I would strongly recommend utilizing the walker. You can bear weight as tolerated. Follow-up with orthopedic surgery.  I have spoken with Dr. French Ana about your case.  Take Tylenol or Motrin for pain control.  For breakthrough pain you can use the prescribed Percocet as needed.  Dr. French Ana recommended taking a baby aspirin daily for now as well.  If you are having uncontrolled pain, further falls or other new concerning symptom, come back to ER for reassessment.

## 2021-10-25 NOTE — ED Triage Notes (Signed)
Pt's dog pulled her down 2 stairs. Pt c/o injury and pain to L groin. Pt has multiple bruises and abrasions. Denies LOC. Pt to ED room via W/C. A/Ox4 on exam.

## 2021-10-25 NOTE — ED Notes (Signed)
Pt d/c home per MD order. Discharge summary reviewed with pt, pt verbalizes understanding. Off unit via WC- no s/s of acute distress noted at discharge. Pt visitor is discharge ride home

## 2021-10-26 NOTE — ED Provider Notes (Signed)
[facility] EMERGENCY DEPT Provider Note   CSN: [id number] Arrival date & time: 10/25/21  1130     History  Chief Complaint  Patient presents with   Jenna Lowe is a 76 y.o. female.  Presented to the emergency department after fall, hip pain.  Patient reports fall actually occurred on Wednesday.  Fell from standing, no LOC.  Did not hit head.  Did fall on her left side, primarily to left hip.  She initially noted mild amount of pain but was able to walk.  Yesterday felt okay, still having some pain.  Today however pain was more intense.  Pain is minimal at rest and significantly worsened with movement or bearing weight.  Otherwise has been feeling okay.  Accompanied by multiple family members, they state they have good family support from patient, patient has access to a walker at home.  Able to stay with family for now while recovering from fall if needed.  HPI     Home Medications Prior to Admission medications   Medication Sig Start Date End Date Taking? Authorizing Provider  ALPRAZolam (XANAX) 0.25 MG tablet Take 0.25 mg by mouth every 6 (six) hours as needed for anxiety. 08/17/20   [provider]  aspirin 81 MG tablet Take 81 mg by mouth daily.    [provider]  cetirizine (ZYRTEC ALLERGY) 10 MG tablet Take 1 tablet (10 mg total) by mouth daily. 01/21/17   Nicholas Lose, MD  Cholecalciferol (VITAMIN D3) 50 MCG (2000 UT) TABS Take 2,000 Units by mouth daily.    [provider]  clopidogrel (PLAVIX) 75 MG tablet Take 1 tablet (75 mg total) by mouth daily. 02/26/21   Marty Heck, MD  fluticasone (FLONASE) 50 MCG/ACT nasal spray Place 1 spray into both nostrils daily.    [provider]  hydrochlorothiazide (MICROZIDE) 12.5 MG capsule Take 1 capsule (12.5 mg total) by mouth daily. 08/11/16   Nicholas Lose, MD  ibuprofen (ADVIL,MOTRIN) 200 MG tablet Take 400 mg by mouth every 8 (eight) hours as needed for  mild pain or headache.    [provider]  lisinopril (PRINIVIL,ZESTRIL) 20 MG tablet Take 20 mg by mouth daily.    [provider]  Multiple Vitamins-Minerals (MULTIVITAMIN WITH MINERALS) tablet Take 1 tablet by mouth daily.    [provider]  oxyCODONE-acetaminophen (PERCOCET/ROXICET) 5-325 MG tablet Take 1 tablet by mouth every 6 (six) hours as needed for severe pain. 10/25/21   Lucrezia Starch, MD  pantoprazole (PROTONIX) 40 MG tablet Take 40 mg by mouth daily. 11/25/20   [provider]  Probiotic Product (PROBIOTIC DAILY PO) Take 1 tablet by mouth 2 (two) times a week.    [provider]  simvastatin (ZOCOR) 10 MG tablet Take 1 tablet (10 mg total) by mouth at bedtime. 02/26/21   Marty Heck, MD  traMADol (ULTRAM) 50 MG tablet Take 1 tablet (50 mg total) by mouth every 6 (six) hours as needed. Patient not taking: Reported on 04/16/2021 03/21/21 03/21/22  Karoline Caldwell, PA-C      Allergies    Pseudoephedrine, Sulfa antibiotics, Arimidex [anastrozole], Levofloxacin, Pravachol [pravastatin], and Amoxicillin-pot clavulanate    Review of Systems   Review of Systems  Constitutional:  Negative for chills and fever.  HENT:  Negative for ear pain and sore throat.   Eyes:  Negative for pain and visual disturbance.  Respiratory:  Negative for cough and shortness of breath.   Cardiovascular:  Negative for chest pain and palpitations.  Gastrointestinal:  Negative for abdominal pain and vomiting.  Genitourinary:  Negative for dysuria and hematuria.  Musculoskeletal:  Positive for arthralgias. Negative for back pain.  Skin:  Negative for color change and rash.  Neurological:  Negative for seizures and syncope.  All other systems reviewed and are negative.  Physical Exam Updated Vital Signs BP 128/70   Pulse 66   Temp 97.8 F (36.6 C) (Oral)   Resp 18   Ht '5\' 3"'$  (1.6 m)   Wt 57.2 kg   SpO2 95%   BMI 22.32 kg/m  Physical  Exam Vitals and nursing note reviewed.  Constitutional:      General: She is not in acute distress.    Appearance: She is well-developed.  HENT:     Head: Normocephalic and atraumatic.  Eyes:     Conjunctiva/sclera: Conjunctivae normal.  Cardiovascular:     Rate and Rhythm: Normal rate and regular rhythm.     Heart sounds: No murmur heard. Pulmonary:     Effort: Pulmonary effort is normal. No respiratory distress.     Breath sounds: Normal breath sounds.  Abdominal:     Palpations: Abdomen is soft.     Tenderness: There is no abdominal tenderness.  Musculoskeletal:        General: No swelling.     Cervical back: Neck supple.     Comments: Back: no C, T, L spine TTP, no step off or deformity RUE: no TTP throughout, no deformity, normal joint ROM, radial pulse intact, distal sensation and motor intact LUE: no TTP throughout, no deformity, normal joint ROM, radial pulse intact, distal sensation and motor intact RLE:  no TTP throughout, no deformity, normal joint ROM, distal pulse, sensation and motor intact LLE: ttp in left hip, hip ROM limited 2/2 pain, distal pulse, sensation and motor intact  Skin:    General: Skin is warm and dry.     Capillary Refill: Capillary refill takes less than 2 seconds.  Neurological:     Mental Status: She is alert.  Psychiatric:        Mood and Affect: Mood normal.    ED Results / Procedures / Treatments   Labs (all labs ordered are listed, but only abnormal results are displayed) Labs Reviewed - No data to display  EKG None  Radiology CT Hip Left Wo Contrast  Result Date: 10/25/2021 CLINICAL DATA:  Hip pain, stress fracture suspected, neg xray Hip trauma, fracture suspected, xray done EXAM: CT OF THE LEFT HIP WITHOUT CONTRAST TECHNIQUE: Multidetector CT imaging of the left hip was performed according to the standard protocol. Multiplanar CT image reconstructions were also generated. RADIATION DOSE REDUCTION: This exam was performed according  to the departmental dose-optimization program which includes automated exposure control, adjustment of the mA and/or kV according to patient size and/or use of iterative reconstruction technique. COMPARISON:  Same day hip radiograph, CT 09/03/2020 FINDINGS: Bones/Joint/Cartilage There is displaced left inferior pubic ramus fracture, full shaft width with foreshortening. There is a minimally displaced left pubic body fracture near the pubic symphysis. There is no evidence of left femoral neck fracture. Mild left hip osteoarthritis. Ligaments Suboptimally assessed by CT. Muscles and Tendons No acute myotendinous abnormality by CT. No significant muscle atrophy. Soft tissues Mild swelling adjacent to the fractures.  No focal fluid collection. IMPRESSION: Displaced left inferior pubic ramus fracture. Minimally displaced left pubic body fracture near the pubic symphysis. Electronically Signed   By: Ileene Patrick.D.  On: 10/25/2021 13:35   DG Hip Unilat With Pelvis 2-3 Views Left  Result Date: 10/25/2021 CLINICAL DATA:  Fall, pain EXAM: DG HIP (WITH OR WITHOUT PELVIS) 2-3V LEFT COMPARISON:  None Available. FINDINGS: There is no evidence of displaced hip fracture or dislocation. There is no evidence of arthropathy or other focal bone abnormality. IMPRESSION: No displaced hip fracture or dislocation. Please note that plain radiographs are significantly insensitive for hip and pelvic fracture. Consider CT or MRI to further evaluate if there is high clinical suspicion for fracture. Electronically Signed   By: Delanna Ahmadi M.D.   On: 10/25/2021 12:03    Procedures Procedures    Medications Ordered in ED Medications  oxyCODONE-acetaminophen (PERCOCET/ROXICET) 5-325 MG per tablet 1 tablet (1 tablet Oral Given 10/25/21 1239)    ED Course/ Medical Decision Making/ A&P                           Medical Decision Making Amount and/or Complexity of Data Reviewed Radiology: ordered.  Risk Prescription drug  management.   76 year old lady presented to the ER after having a ground-level fall 2 days ago now having progressive left hip pain.  Her initial plain films were obtained and were negative for acute traumatic pathology.  Due to the severity of her pain and localized reproducible tenderness, I checked CT to look for an occult fracture.  CT scan is concerning for nondisplaced left inferior pubic ramus fracture and a minimally displaced left pubic body fracture near pubic symphysis.  I discussed these findings with Dr. French Ana on-call for orthopedic surgery.  He advises weightbearing as tolerated, follow-up with him in the outpatient setting.  Reassessed patient, pain has improved after Percocet.  I had lengthy discussion with patient about her recovery process and about the orthopedic recommendations.  Additionally discussed disposition with her family members at bedside.  Offered admission for pain control, PT/OT evaluation.  Alternatively discussed plan for discharge with good family support and outpatient referrals for home health PT and Ortho.  Family and patient would like to go home at this time.  Feel this is reasonable option given adequate pain control and patient's overall well appearance.  Reviewed return precautions and discharged home.  Given info for Ortho follow-up.  I have additionally placed orders for home health PT and placed consult order for TOC to assist in setting this up.    After the discussed management above, the patient was determined to be safe for discharge.  The patient was in agreement with this plan and all questions regarding their care were answered.  ED return precautions were discussed and the patient will return to the ED with any significant worsening of condition. .        Final Clinical Impression(s) / ED Diagnoses Final diagnoses:  Closed fracture of left inferior pubic ramus, initial encounter ([place])    Rx / DC Orders ED Discharge Orders           Ordered    oxyCODONE-acetaminophen (PERCOCET/ROXICET) 5-325 MG tablet  Every 6 hours PRN,   Status:  Discontinued        10/25/21 1459    oxyCODONE-acetaminophen (PERCOCET/ROXICET) 5-325 MG tablet  Every 6 hours PRN        10/25/21 1500              Lucrezia Starch, MD 10/26/21 2250

## 2021-11-19 ENCOUNTER — Other Ambulatory Visit: Payer: Self-pay | Admitting: Internal Medicine

## 2021-11-19 DIAGNOSIS — Z1231 Encounter for screening mammogram for malignant neoplasm of breast: Secondary | ICD-10-CM

## 2021-12-16 ENCOUNTER — Ambulatory Visit
Admission: RE | Admit: 2021-12-16 | Discharge: 2021-12-16 | Disposition: A | Discharge status: Home / Self Care | Payer: Medicare Other | Source: Ambulatory Visit | Attending: Internal Medicine | Admitting: Internal Medicine

## 2021-12-16 DIAGNOSIS — Z1231 Encounter for screening mammogram for malignant neoplasm of breast: Secondary | ICD-10-CM

## 2022-04-28 ENCOUNTER — Other Ambulatory Visit: Payer: Self-pay | Admitting: *Deleted

## 2022-04-28 DIAGNOSIS — I6523 Occlusion and stenosis of bilateral carotid arteries: Secondary | ICD-10-CM

## 2022-04-29 ENCOUNTER — Ambulatory Visit (HOSPITAL_COMMUNITY)
Admission: RE | Admit: 2022-04-29 | Discharge: 2022-04-29 | Disposition: A | Discharge status: Home / Self Care | Payer: Medicare Other | Source: Ambulatory Visit | Attending: Vascular Surgery | Admitting: Vascular Surgery

## 2022-04-29 ENCOUNTER — Encounter: Payer: Self-pay | Admitting: Vascular Surgery

## 2022-04-29 ENCOUNTER — Ambulatory Visit: Payer: Medicare Other | Admitting: Vascular Surgery

## 2022-04-29 VITALS — BP 125/85 | HR 83 | Temp 97.4°F | Resp 14 | Ht 63.0 in | Wt 125.0 lb

## 2022-04-29 DIAGNOSIS — I6523 Occlusion and stenosis of bilateral carotid arteries: Secondary | ICD-10-CM | POA: Insufficient documentation

## 2022-04-29 DIAGNOSIS — I6522 Occlusion and stenosis of left carotid artery: Secondary | ICD-10-CM

## 2022-04-29 NOTE — Progress Notes (Signed)
Patient name: Jenna Lowe MRN: [id number] DOB: 01-19-1946 Sex: female  REASON FOR CONSULT: 9 month follow-up for surveillance of carotid artery disease  HPI: Jenna Lowe is a 76 y.o. female, that presents for surveillance of her carotid artery disease.  Previous underwent a left carotid endarterectomy with Dr. Donnetta Hutching in 2019.  On surveillance was found to have high-grade stenosis in the distal common carotid artery at the proximal patch site.  She underwent left TCAR on 03/20/2021.  Overall today she states she is doing well.  Has had some trouble tolerating the Zocor but remains on aspirin.  Has stopped the Plavix.  No neurologic events since last evaluation.  Past Medical History:  Diagnosis Date   Breast cancer ([place]) 2018   Left breast   Breast cancer of upper-outer quadrant of left female breast ([place])    Depression 2010   H/O seasonal allergies    History of kidney stones    Hyperlipidemia    Hypertension    Personal history of radiation therapy     Past Surgical History:  Procedure Laterality Date   BREAST BIOPSY Right 2018   benign   BREAST LUMPECTOMY Left 07/22/2016   BREAST LUMPECTOMY WITH RADIOACTIVE SEED AND SENTINEL LYMPH NODE BIOPSY Left 07/22/2016   Procedure: BREAST LUMPECTOMY WITH RADIOACTIVE SEED AND SENTINEL LYMPH NODE BIOPSY;  Surgeon: Excell Seltzer, MD;  Location: [facility];  Service: General;  Laterality: Left;   CATARACT EXTRACTION, BILATERAL     ENDARTERECTOMY Left 08/03/2017   Procedure: ENDARTERECTOMY CAROTID LEFT;  Surgeon: Rosetta Posner, MD;  Location: [facility];  Service: Vascular;  Laterality: Left;   TRANSCAROTID ARTERY REVASCULARIZATION  Left 03/20/2021   Procedure: LEFT TRANSCAROTID ARTERY REVASCULARIZATION;  Surgeon: Marty Heck, MD;  Location: [facility];  Service: Vascular;  Laterality: Left;   ULTRASOUND GUIDANCE FOR VASCULAR ACCESS  03/20/2021   Procedure: ULTRASOUND GUIDANCE FOR VASCULAR ACCESS;   Surgeon: Marty Heck, MD;  Location: [facility];  Service: Vascular;;    Family History  Problem Relation Age of Onset   Lung cancer Sister    Heart disease Sister    Hypertension Sister    Cancer Sister    Stroke Sister    COPD Sister    Kidney disease Mother    Heart disease Father     SOCIAL HISTORY: Social History   Socioeconomic History   Marital status: Widowed    Spouse name: Not on file   Number of children: Not on file   Years of education: Not on file   Highest education level: Not on file  Occupational History   Not on file  Tobacco Use   Smoking status: Every Day    Packs/day: 1.00    Years: 30.00    Total pack years: 30.00    Types: Cigarettes   Smokeless tobacco: Never  Vaping Use   Vaping Use: Never used  Substance and Sexual Activity   Alcohol use: No   Drug use: No   Sexual activity: Not Currently  Other Topics Concern   Not on file  Social History Narrative   Not on file   Social Determinants of Health   Financial Resource Strain: Not on file  Food Insecurity: Not on file  Transportation Needs: Not on file  Physical Activity: Not on file  Stress: Not on file  Social Connections: Not on file  Intimate Partner Violence: Not on file    Allergies  Allergen Reactions   Pseudoephedrine  Other (See Comments)    Insomnia   Sulfa Antibiotics Itching   Arimidex [Anastrozole] Other (See Comments)    Night Sweats   Levofloxacin     Other reaction(s): palpitations   Pravachol [Pravastatin]     Other reaction(s): myalgia   Amoxicillin-Pot Clavulanate Diarrhea    Current Outpatient Medications  Medication Sig Dispense Refill   ALPRAZolam (XANAX) 0.25 MG tablet Take 0.25 mg by mouth every 6 (six) hours as needed for anxiety.     aspirin 81 MG tablet Take 81 mg by mouth daily.     cetirizine (ZYRTEC ALLERGY) 10 MG tablet Take 1 tablet (10 mg total) by mouth daily.     Cholecalciferol (VITAMIN D3) 50 MCG (2000 UT) TABS Take 2,000 Units by  mouth daily.     fluticasone (FLONASE) 50 MCG/ACT nasal spray Place 1 spray into both nostrils daily.     hydrochlorothiazide (MICROZIDE) 12.5 MG capsule Take 1 capsule (12.5 mg total) by mouth daily.     ibuprofen (ADVIL,MOTRIN) 200 MG tablet Take 400 mg by mouth every 8 (eight) hours as needed for mild pain or headache.     lisinopril (PRINIVIL,ZESTRIL) 20 MG tablet Take 20 mg by mouth daily.     Multiple Vitamins-Minerals (MULTIVITAMIN WITH MINERALS) tablet Take 1 tablet by mouth daily.     pantoprazole (PROTONIX) 40 MG tablet Take 40 mg by mouth daily.     Probiotic Product (PROBIOTIC DAILY PO) Take 1 tablet by mouth 2 (two) times a week.     simvastatin (ZOCOR) 10 MG tablet Take 1 tablet (10 mg total) by mouth at bedtime. 90 tablet 1   clopidogrel (PLAVIX) 75 MG tablet Take 1 tablet (75 mg total) by mouth daily. (Patient not taking: Reported on 04/29/2022) 30 tablet 6   oxyCODONE-acetaminophen (PERCOCET/ROXICET) 5-325 MG tablet Take 1 tablet by mouth every 6 (six) hours as needed for severe pain. (Patient not taking: Reported on 04/29/2022) 12 tablet 0   No current facility-administered medications for this visit.    REVIEW OF SYSTEMS:  '[X]'$  denotes positive finding, '[ ]'$  denotes negative finding Cardiac  Comments:  Chest pain or chest pressure:    Shortness of breath upon exertion:    Short of breath when lying flat:    Irregular heart rhythm:        Vascular    Pain in calf, thigh, or hip brought on by ambulation:    Pain in feet at night that wakes you up from your sleep:     Blood clot in your veins:    Leg swelling:         Pulmonary    Oxygen at home:    Productive cough:     Wheezing:         Neurologic    Sudden weakness in arms or legs:     Sudden numbness in arms or legs:     Sudden onset of difficulty speaking or slurred speech:    Temporary loss of vision in one eye:     Problems with dizziness:         Gastrointestinal    Blood in stool:     Vomited blood:          Genitourinary    Burning when urinating:     Blood in urine:        Psychiatric    Major depression:         Hematologic    Bleeding problems:    Problems with  blood clotting too easily:        Skin    Rashes or ulcers:        Constitutional    Fever or chills:      PHYSICAL EXAM: Vitals:   04/29/22 1346 04/29/22 1349  BP: (!) 143/82 125/85  Pulse: 83 83  Resp: 14   Temp: (!) 97.4 F (36.3 C)   TempSrc: Temporal   SpO2: 94%   Weight: 125 lb (56.7 kg)   Height: '5\' 3"'$  (1.6 m)     GENERAL: The patient is a well-nourished female, in no acute distress. The vital signs are documented above. CARDIAC: There is a regular rate and rhythm.  VASCULAR:  Left neck incision well-healed NEUROLOGIC: No focal weakness or paresthesias are detected.  CN II-XII grossly intact.  DATA:   Carotid duplex today shows minimal 1 to 39% stenosis on the right and a patent left carotid stent  Assessment/Plan:  76 y.o. female, that presents for surveillance of her carotid artery disease.  Previous underwent a left carotid endarterectomy with Dr. Donnetta Hutching in 2019.  On surveillance was found to have high-grade stenosis in the distal common carotid artery at the proximal patch site.  She underwent left TCAR on 03/20/2021.  Discussed that her left carotid stent is widely patent on duplex today.  No evidence of recurrent stenosis.  She also has no evidence of significant contralateral disease with 1-39% right ICA stenosis.  I have asked that she stay on aspirin and statin for risk reduction.  I will have her follow-up in 1 year in the [facility] for continued surveillance with carotid duplex.   Marty Heck, MD [facility] Office: [phone]

## 2022-07-02 IMAGING — CT CT HIP*L* W/O CM
2 of 4 series · 16 of 46 positions shown, 18 images · non-contrast
Comparison: Same day hip radiograph, CT 09/03/2020

CLINICAL DATA: Hip pain, stress fracture suspected, neg xray Hip
trauma, fracture suspected, xray done

EXAM:
CT OF THE LEFT HIP WITHOUT CONTRAST
TECHNIQUE: Multidetector CT imaging of the left hip was performed according to
the standard protocol. Multiplanar CT image reconstructions were
also generated.
RADIATION DOSE REDUCTION: This exam was performed according to the
departmental dose-optimization program which includes automated
exposure control, adjustment of the mA and/or kV according to
patient size and/or use of iterative reconstruction technique.

[Series 4: thin soft · axial · 0.47mm/px · z∈[-309,-172]mm · 13 of 299 slices shown, 15 images]
[im 12/299  soft-tissue]
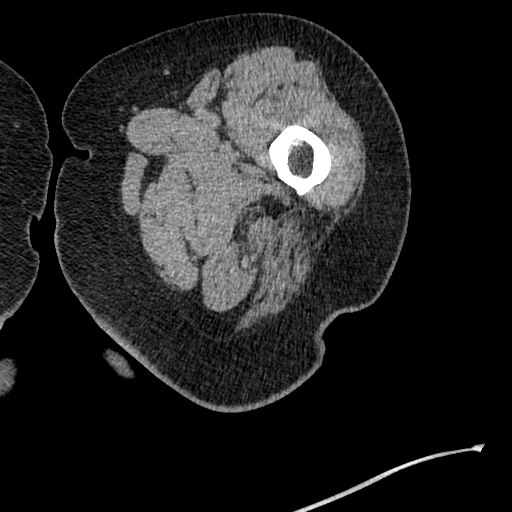
[im 12/299  bone]
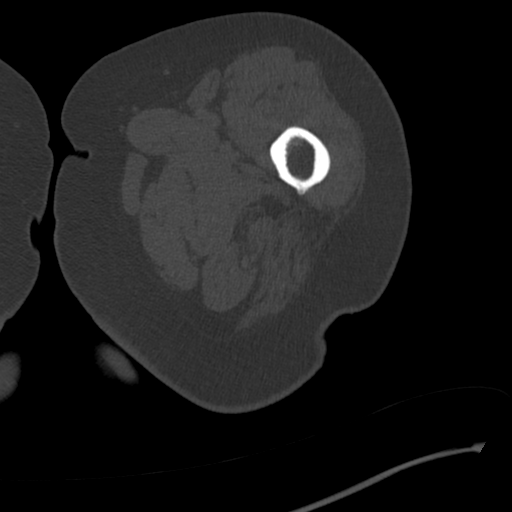
[im 36/299  soft-tissue]
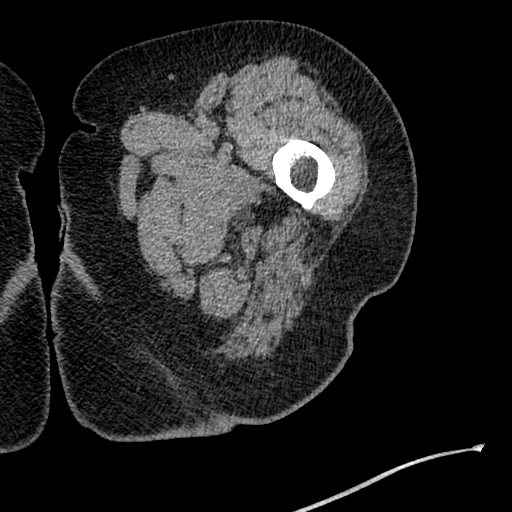
[im 60/299  soft-tissue]
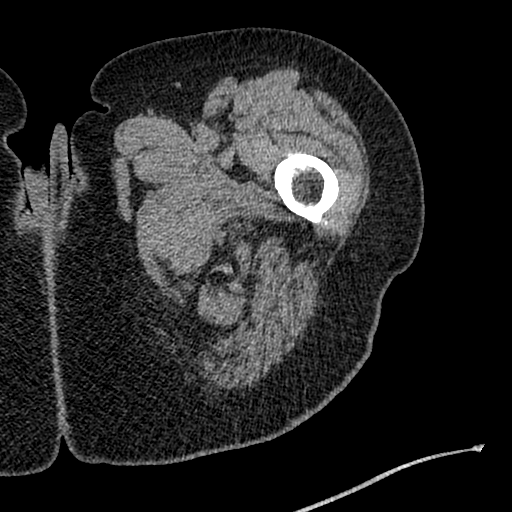
[im 84/299  soft-tissue]
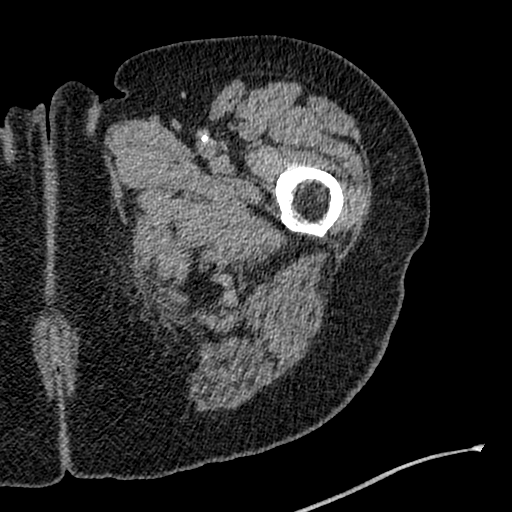
[im 108/299  soft-tissue]
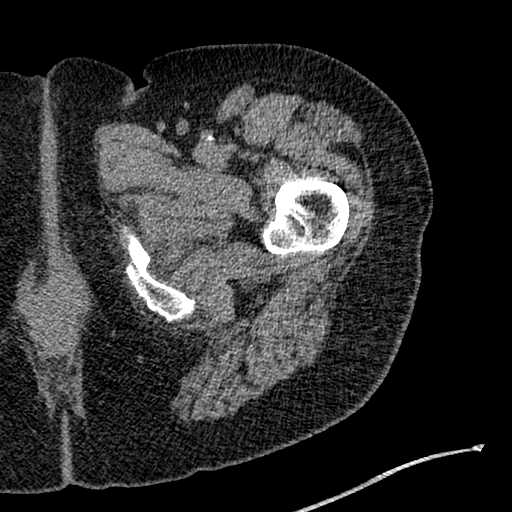
[im 132/299  soft-tissue]
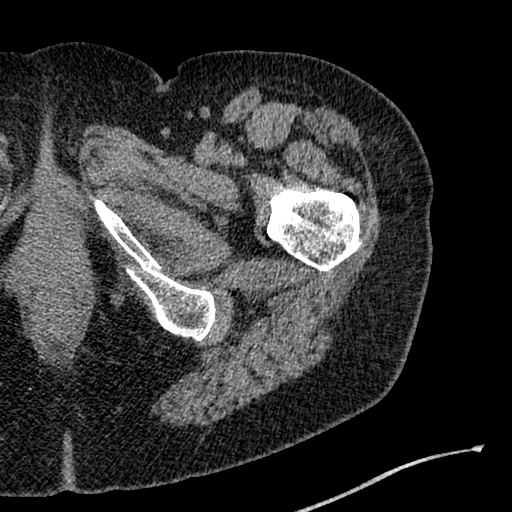
[im 155/299  soft-tissue]
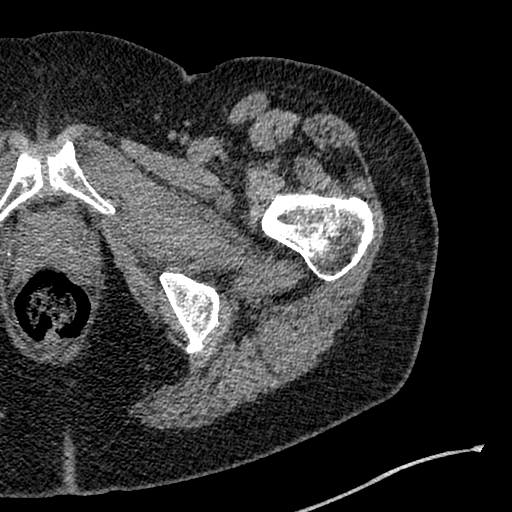
[im 167/299  soft-tissue]
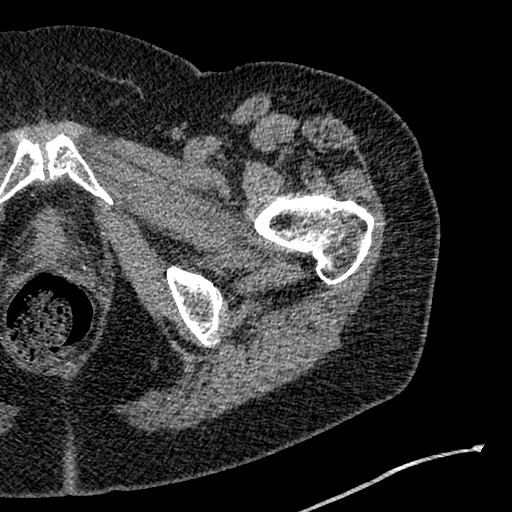
[im 191/299  soft-tissue]
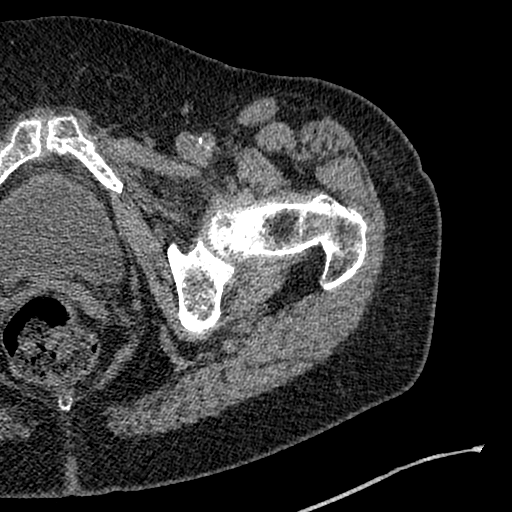
[im 191/299  bone]
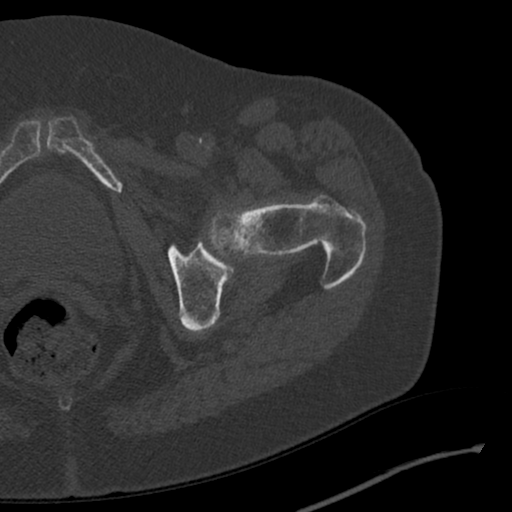
[im 215/299  soft-tissue]
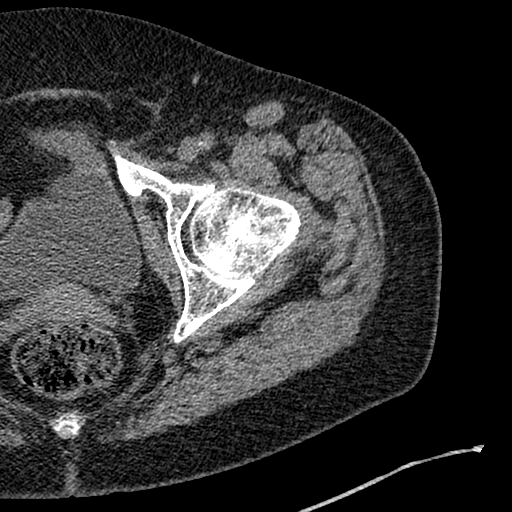
[im 239/299  soft-tissue]
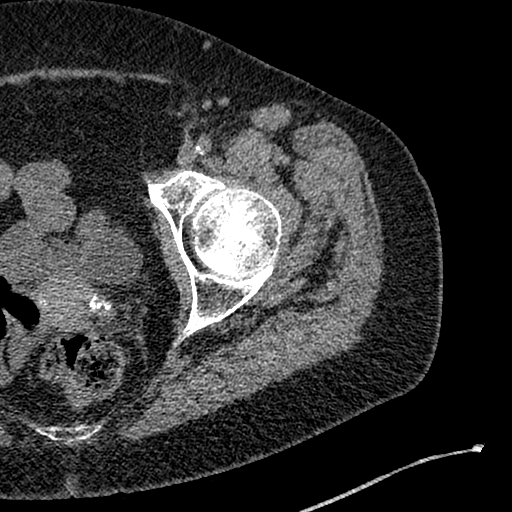
[im 263/299  soft-tissue]
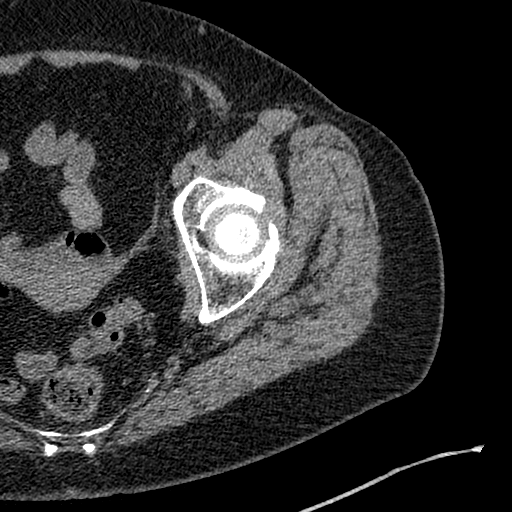
[im 287/299  soft-tissue]
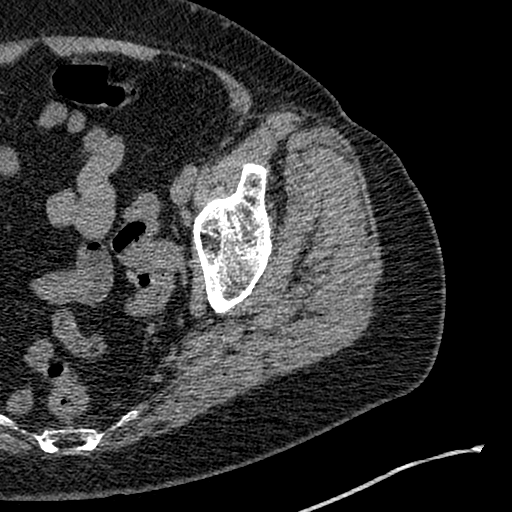

[Series 7: coronal soft · coronal · 0.33mm/px · 3 of 117 slices shown]
[im 39/117  soft-tissue]
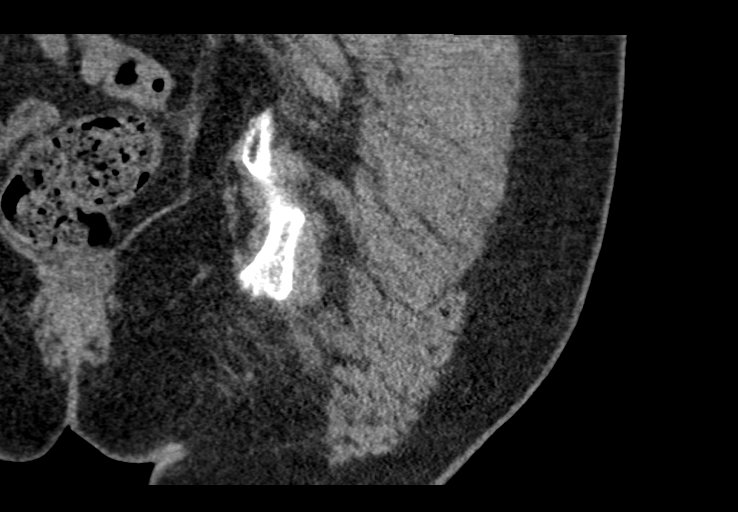
[im 52/117  soft-tissue]
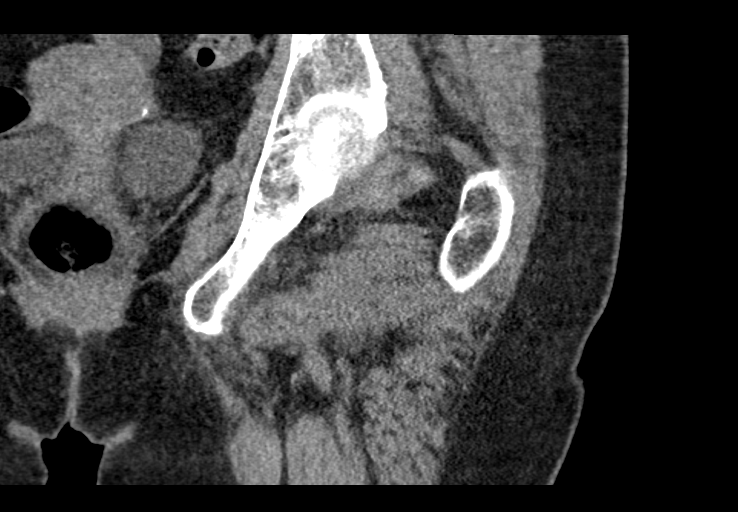
[im 65/117  soft-tissue]
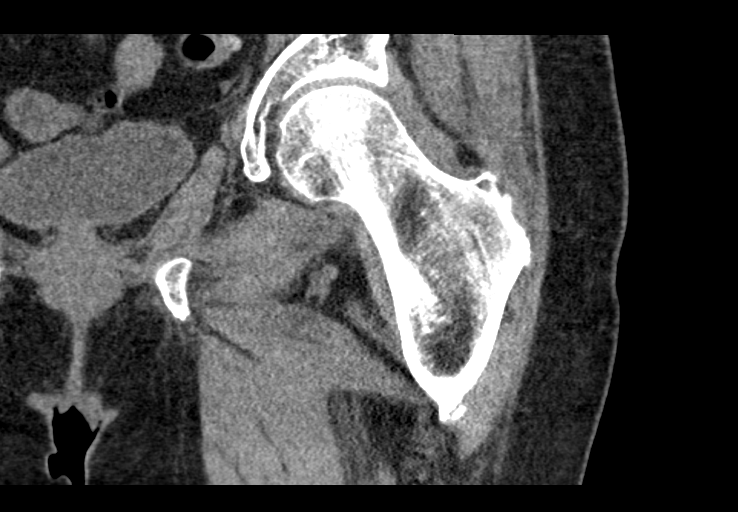

[16 of 46 positions shown; findings below may reference images not displayed]

FINDINGS: Bones/Joint/Cartilage

There is displaced left inferior pubic ramus fracture, full shaft
width with foreshortening. There is a minimally displaced left pubic
body fracture near the pubic symphysis. There is no evidence of left
femoral neck fracture. Mild left hip osteoarthritis.

Ligaments

Suboptimally assessed by CT.

Muscles and Tendons

No acute myotendinous abnormality by CT. No significant muscle
atrophy.

Soft tissues

Mild swelling adjacent to the fractures.  No focal fluid collection.
IMPRESSION: Displaced left inferior pubic ramus fracture.

Minimally displaced left pubic body fracture near the pubic
symphysis.

## 2022-07-02 IMAGING — DX DG HIP (WITH OR WITHOUT PELVIS) 2-3V*L*
3 series · 3 of 3 positions shown · non-contrast
Comparison: None Available.

CLINICAL DATA: Fall, pain

EXAM:
DG HIP (WITH OR WITHOUT PELVIS) 2-3V LEFT

[hip ap]
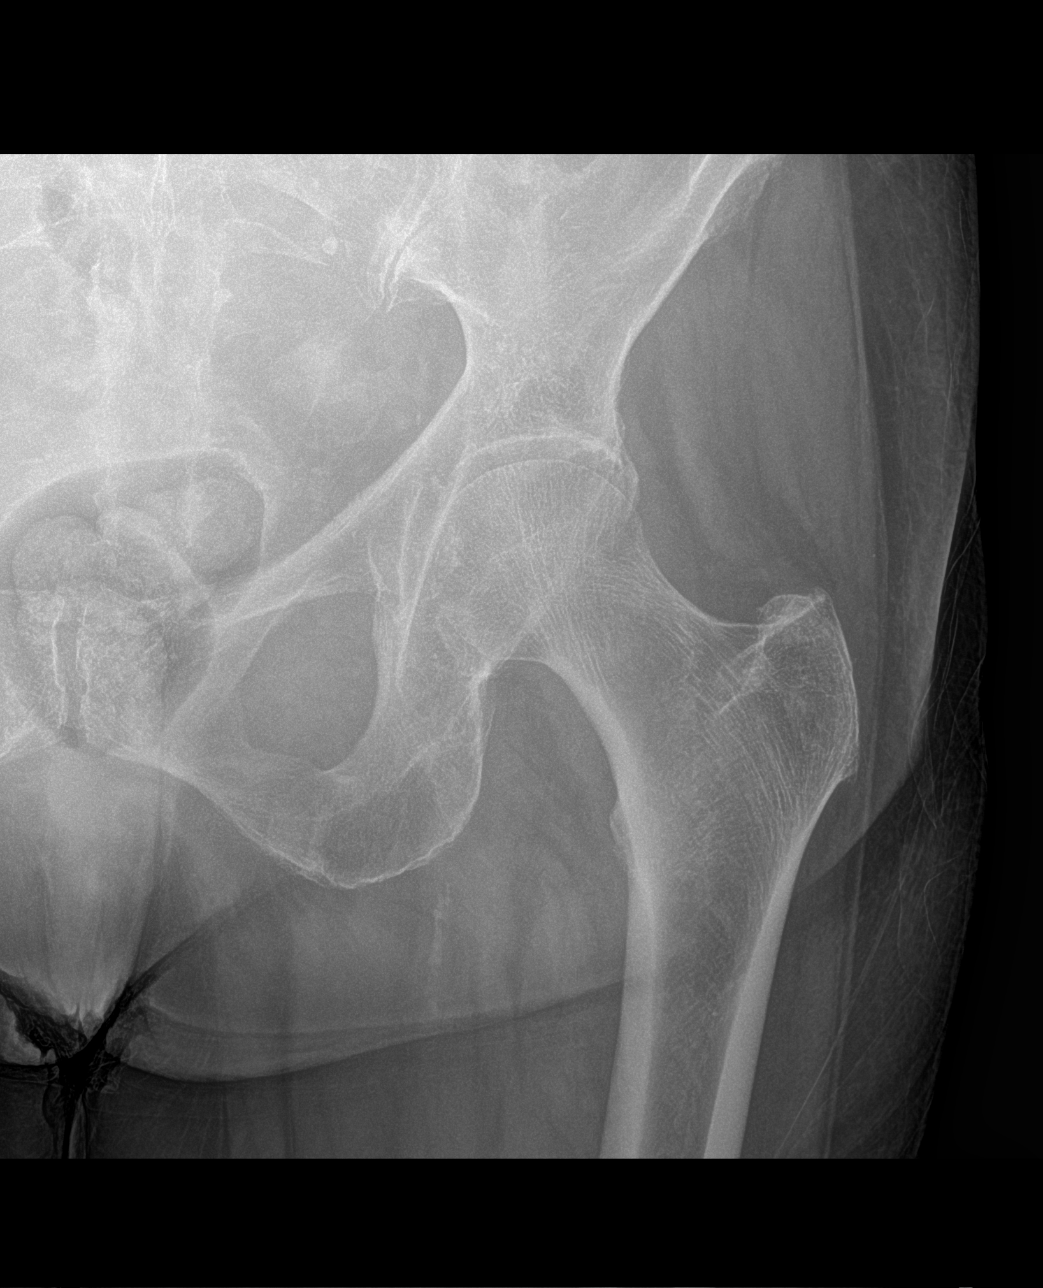

[pelvis ap]
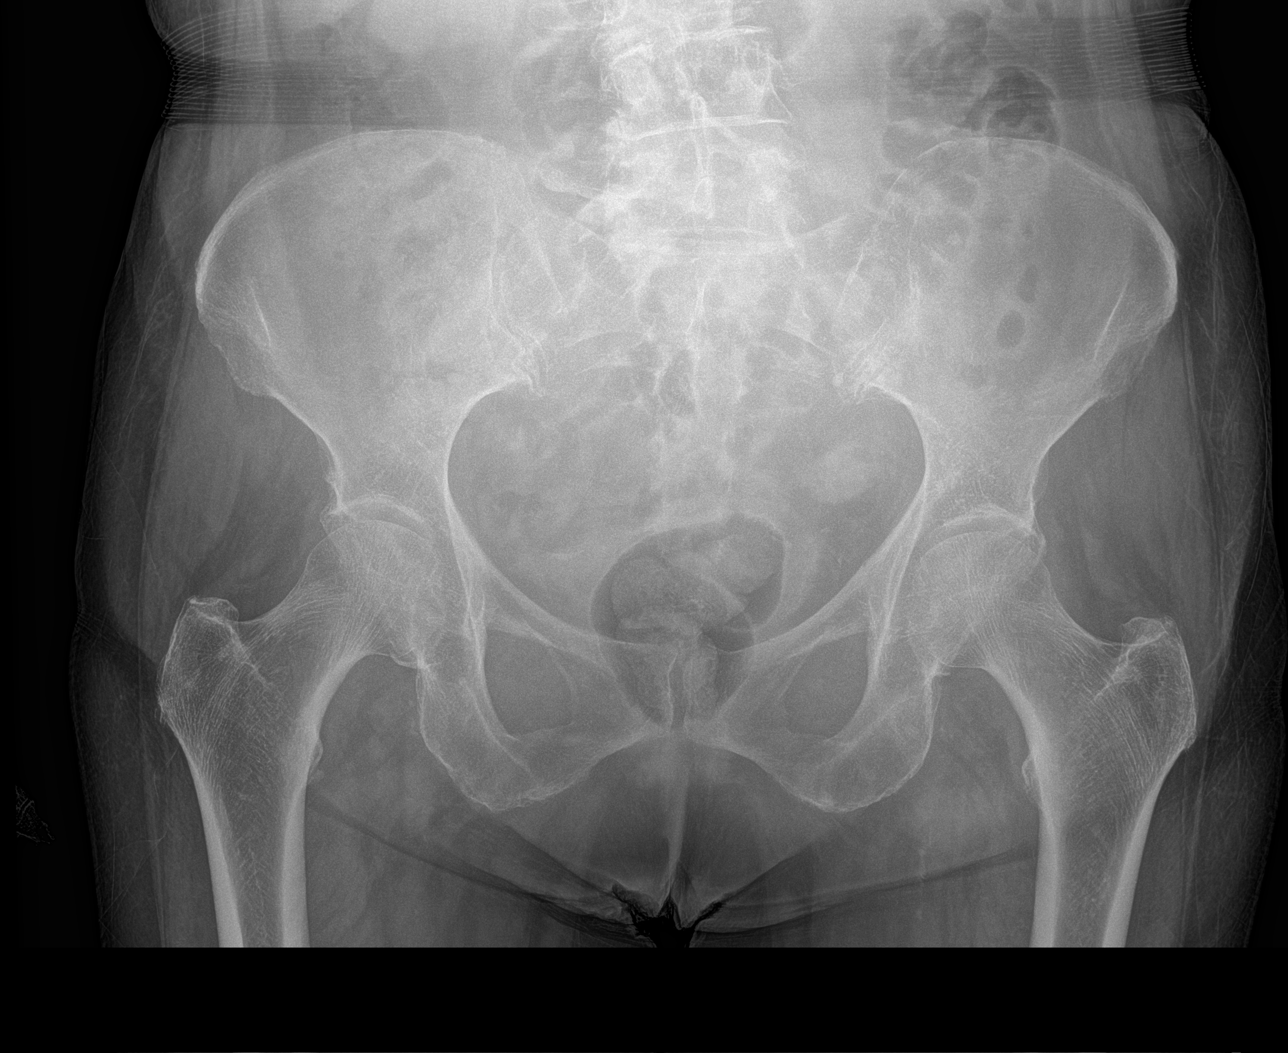

[hip frog leg]
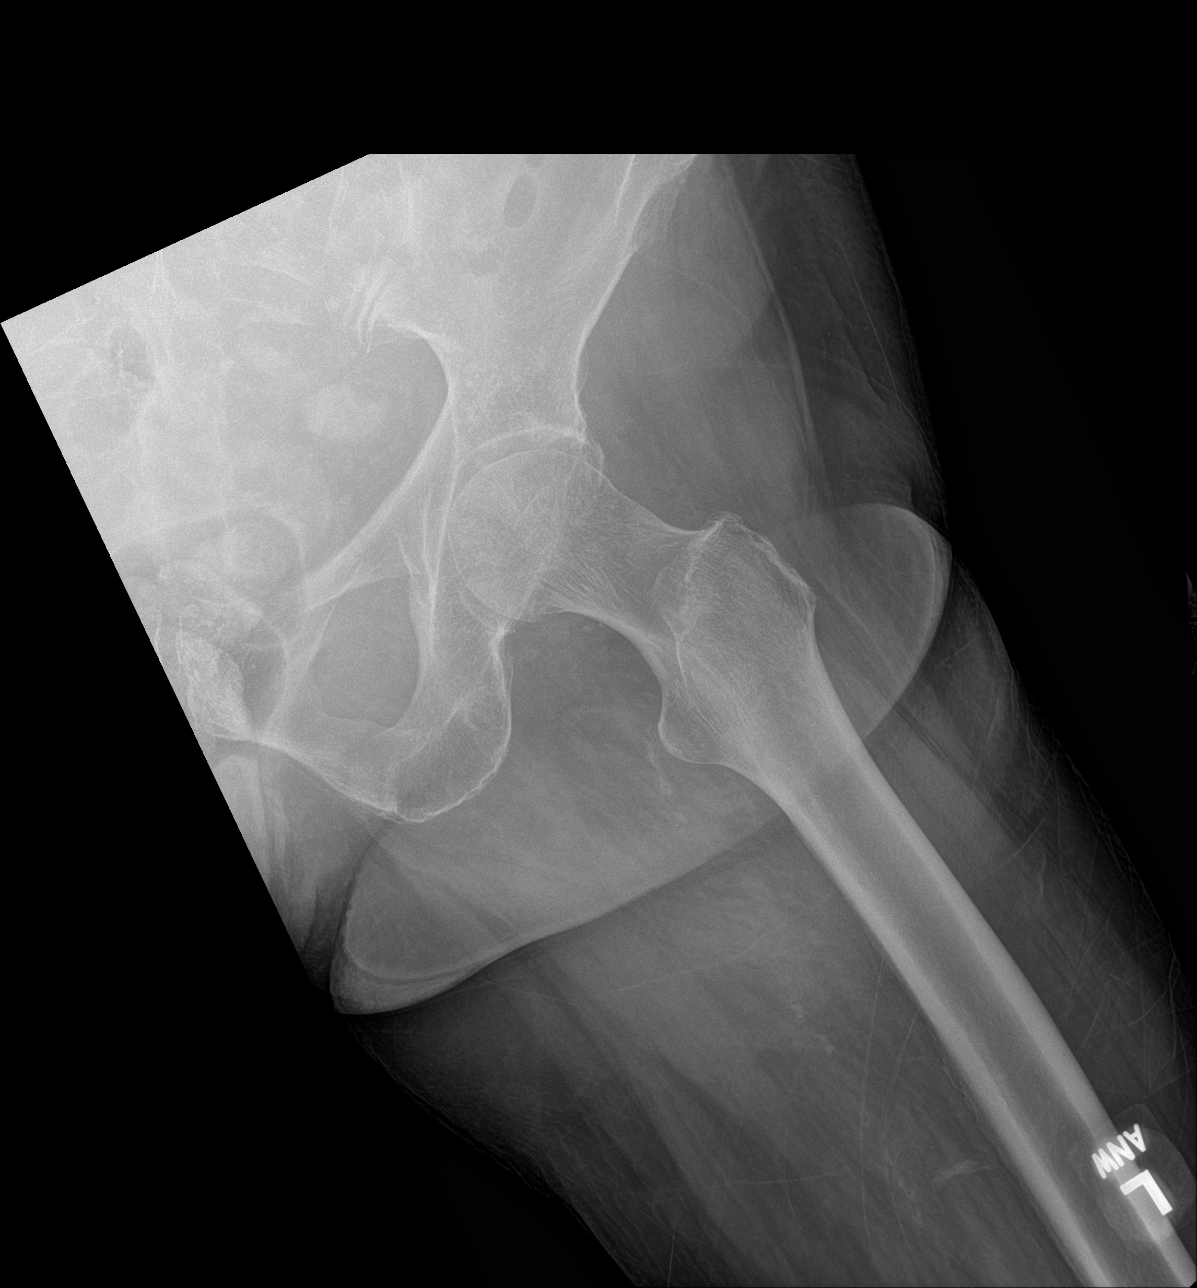

[3 of 3 positions shown; findings below may reference images not displayed]

FINDINGS: There is no evidence of displaced hip fracture or dislocation. There
is no evidence of arthropathy or other focal bone abnormality.
IMPRESSION: No displaced hip fracture or dislocation. Please note that plain
radiographs are significantly insensitive for hip and pelvic
fracture. Consider CT or MRI to further evaluate if there is high
clinical suspicion for fracture.

## 2022-08-26 ENCOUNTER — Other Ambulatory Visit: Payer: Self-pay | Admitting: Internal Medicine

## 2022-08-26 DIAGNOSIS — R0781 Pleurodynia: Secondary | ICD-10-CM

## 2022-09-30 ENCOUNTER — Ambulatory Visit
Admission: RE | Admit: 2022-09-30 | Discharge: 2022-09-30 | Disposition: A | Discharge status: Home / Self Care | Payer: Medicare Other | Source: Ambulatory Visit | Attending: Internal Medicine | Admitting: Internal Medicine

## 2022-09-30 DIAGNOSIS — R0781 Pleurodynia: Secondary | ICD-10-CM

## 2022-11-20 ENCOUNTER — Other Ambulatory Visit: Payer: Self-pay | Admitting: Internal Medicine

## 2022-11-20 DIAGNOSIS — Z1231 Encounter for screening mammogram for malignant neoplasm of breast: Secondary | ICD-10-CM

## 2022-12-25 ENCOUNTER — Ambulatory Visit
Admission: RE | Admit: 2022-12-25 | Discharge: 2022-12-25 | Disposition: A | Discharge status: Home / Self Care | Payer: Medicare Other | Source: Ambulatory Visit | Attending: Internal Medicine | Admitting: Internal Medicine

## 2022-12-25 DIAGNOSIS — Z1231 Encounter for screening mammogram for malignant neoplasm of breast: Secondary | ICD-10-CM

## 2023-01-11 ENCOUNTER — Emergency Department (HOSPITAL_BASED_OUTPATIENT_CLINIC_OR_DEPARTMENT_OTHER)
Admission: EM | Admit: 2023-01-11 | Discharge: 2023-01-11 | Disposition: A | Discharge status: Home / Self Care | Payer: Medicare Other | Attending: Emergency Medicine | Admitting: Emergency Medicine

## 2023-01-11 ENCOUNTER — Other Ambulatory Visit: Payer: Self-pay

## 2023-01-11 ENCOUNTER — Emergency Department (HOSPITAL_BASED_OUTPATIENT_CLINIC_OR_DEPARTMENT_OTHER): Payer: Medicare Other | Admitting: Radiology

## 2023-01-11 ENCOUNTER — Encounter (HOSPITAL_BASED_OUTPATIENT_CLINIC_OR_DEPARTMENT_OTHER): Payer: Self-pay | Admitting: Emergency Medicine

## 2023-01-11 DIAGNOSIS — Z7982 Long term (current) use of aspirin: Secondary | ICD-10-CM | POA: Diagnosis not present

## 2023-01-11 DIAGNOSIS — W06XXXA Fall from bed, initial encounter: Secondary | ICD-10-CM | POA: Insufficient documentation

## 2023-01-11 DIAGNOSIS — S4991XA Unspecified injury of right shoulder and upper arm, initial encounter: Secondary | ICD-10-CM | POA: Diagnosis present

## 2023-01-11 DIAGNOSIS — S42031A Displaced fracture of lateral end of right clavicle, initial encounter for closed fracture: Secondary | ICD-10-CM | POA: Diagnosis not present

## 2023-01-11 MED ORDER — OXYCODONE-ACETAMINOPHEN 5-325 MG PO TABS: 1.0000 | ORAL_TABLET | Freq: Four times a day (QID) | ORAL | 0 refills | Status: AC | PRN

## 2023-01-11 MED ORDER — ACETAMINOPHEN 500 MG PO TABS: 500.0000 mg | ORAL_TABLET | Freq: Four times a day (QID) | ORAL | 0 refills | Status: AC | PRN

## 2023-01-11 MED ORDER — ACETAMINOPHEN 500 MG PO TABS
1000.0000 mg | ORAL_TABLET | Freq: Once | ORAL | Status: AC
  Administered 2023-01-11: 1000 mg via ORAL
  Filled 2023-01-11: qty 2

## 2023-01-11 NOTE — ED Provider Notes (Signed)
[place] EMERGENCY DEPARTMENT AT [facility] Provider Note   CSN: [id number] Arrival date & time: 01/11/23  0740     History  Chief Complaint  Patient presents with   Fall   Shoulder Pain    Jenna Lowe is a 77 y.o. female.  HPI     77 year old female comes in with chief complaint of fall and shoulder pain.  Patient states that she accidentally rolled out of her bed, and fell onto her right shoulder..  She denies any head trauma.  She denies any neck pain.  She did not lose consciousness.  Injury occurred around 5:30 in the morning.  She has a persistent severe pain to the right shoulder -prompting her to come to the emergency room.  Home Medications Prior to Admission medications   Medication Sig Start Date End Date Taking? Authorizing Provider  acetaminophen (TYLENOL) 500 MG tablet Take 1 tablet (500 mg total) by mouth every 6 (six) hours as needed. 01/11/23  Yes Derwood Kaplan, MD  oxyCODONE-acetaminophen (PERCOCET/ROXICET) 5-325 MG tablet Take 1 tablet by mouth every 6 (six) hours as needed for severe pain. 01/11/23  Yes Derwood Kaplan, MD  ALPRAZolam (XANAX) 0.25 MG tablet Take 0.25 mg by mouth every 6 (six) hours as needed for anxiety. 08/17/20   [provider]  aspirin 81 MG tablet Take 81 mg by mouth daily.    [provider]  cetirizine (ZYRTEC ALLERGY) 10 MG tablet Take 1 tablet (10 mg total) by mouth daily. 01/21/17   Serena Croissant, MD  Cholecalciferol (VITAMIN D3) 50 MCG (2000 UT) TABS Take 2,000 Units by mouth daily.    [provider]  clopidogrel (PLAVIX) 75 MG tablet Take 1 tablet (75 mg total) by mouth daily. Patient not taking: Reported on 04/29/2022 02/26/21   Cephus Shelling, MD  fluticasone [facility]) 50 MCG/ACT nasal spray Place 1 spray into both nostrils daily.    [provider]  hydrochlorothiazide (MICROZIDE) 12.5 MG capsule Take 1 capsule (12.5 mg total) by mouth daily. 08/11/16   Serena Croissant,  MD  ibuprofen (ADVIL,MOTRIN) 200 MG tablet Take 400 mg by mouth every 8 (eight) hours as needed for mild pain or headache.    [provider]  lisinopril (PRINIVIL,ZESTRIL) 20 MG tablet Take 20 mg by mouth daily.    [provider]  Multiple Vitamins-Minerals (MULTIVITAMIN WITH MINERALS) tablet Take 1 tablet by mouth daily.    [provider]  pantoprazole (PROTONIX) 40 MG tablet Take 40 mg by mouth daily. 11/25/20   [provider]  Probiotic Product (PROBIOTIC DAILY PO) Take 1 tablet by mouth 2 (two) times a week.    [provider]  simvastatin (ZOCOR) 10 MG tablet Take 1 tablet (10 mg total) by mouth at bedtime. 02/26/21   Cephus Shelling, MD      Allergies    Pseudoephedrine, Sulfa antibiotics, Arimidex [anastrozole], Levofloxacin, Pravachol [pravastatin], and Amoxicillin-pot clavulanate    Review of Systems   Review of Systems  All other systems reviewed and are negative.   Physical Exam Updated Vital Signs BP 133/81 (BP Location: Left Arm)   Pulse 91   Temp 98 F (36.7 C) (Temporal)   Resp 16   Ht 5\' 3"  (1.6 m)   Wt 57.2 kg   SpO2 93%   BMI 22.32 kg/m  Physical Exam Vitals and nursing note reviewed.  Constitutional:      Appearance: She is well-developed.  HENT:     Head: Atraumatic.  Eyes:     Extraocular Movements: Extraocular movements intact.     Pupils: Pupils are equal, round, and reactive to light.  Neck:     Comments: No midline c-spine tenderness, pt able to turn head to 45 degrees bilaterally without any pain and able to flex neck to the chest and extend without any pain or neurologic symptoms.  Cardiovascular:     Rate and Rhythm: Normal rate.  Pulmonary:     Effort: Pulmonary effort is normal.  Musculoskeletal:        General: Swelling and tenderness present. No deformity.     Cervical back: Normal range of motion and neck supple.     Comments: Patient has tenderness over the right shoulder with some  bruising noted over the distal clavicular region  Skin:    General: Skin is warm and dry.  Neurological:     Mental Status: She is alert and oriented to person, place, and time.     ED Results / Procedures / Treatments   Labs (all labs ordered are listed, but only abnormal results are displayed) Labs Reviewed - No data to display  EKG None  Radiology DG Shoulder Right  Result Date: 01/11/2023 CLINICAL DATA:  77 year old female with history of trauma from a fall. Right shoulder pain. EXAM: RIGHT SHOULDER - 2+ VIEW COMPARISON:  No priors. FINDINGS: Three views of the right shoulder demonstrate an acute mildly comminuted fracture of the mid right clavicle which appears approximately 1 shaft width displaced inferiorly, and minimally angulated (approximately 20 degrees of inferior angulation of the distal fracture fragment). Scapula is intact, as are the visualized portions of the right proximal humerus. Humeral head is located. IMPRESSION: 1. Acute minimally comminuted displaced and angulated fracture of the mid right clavicle. Electronically Signed   By: Trudie Reed M.D.   On: 01/11/2023 08:53    Procedures Procedures    Medications Ordered in ED Medications  acetaminophen (TYLENOL) tablet 1,000 mg (1,000 mg Oral Given 01/11/23 1610)    ED Course/ Medical Decision Making/ A&P                                 Medical Decision Making Amount and/or Complexity of Data Reviewed Radiology: ordered.  Risk OTC drugs. Prescription drug management.   This patient presents to the ED with chief complaint(s) of fall, right-sided shoulder pain with pertinent past medical history of hypertension, hyperlipidemia.The complaint involves an extensive differential diagnosis and also carries with it a high risk of complications and morbidity.    The differential diagnosis includes : - ICH - Fractures -proximal humerus versus distal clavicular - Contusions - Soft tissue  injury -Dislocation AC separation.    The initial plan is to get x-ray of the right shoulder. Pt has no headaches or associated nausea, vomiting, seizures, loss of consciousness or new visual complains, weakness, numbness, dizziness or gait instability.  I feel comfortable clearing her brain and C-spine clinically.  Patient will return to the ER if she starts having worsening headaches.  Independent visualization and interpretation of imaging: - I independently visualized the following imaging with scope of interpretation limited to determining acute life threatening conditions related to emergency care: X-ray of the shoulder, which revealed right distal clavicular fracture with slight displacement    Final Clinical Impression(s) / ED Diagnoses Final diagnoses:  Closed displaced fracture of acromial end of right clavicle, initial encounter    Rx / DC  Orders ED Discharge Orders          Ordered    acetaminophen (TYLENOL) 500 MG tablet  Every 6 hours PRN        01/11/23 1030    oxyCODONE-acetaminophen (PERCOCET/ROXICET) 5-325 MG tablet  Every 6 hours PRN        01/11/23 1036              Derwood Kaplan, MD 01/11/23 1043

## 2023-01-11 NOTE — Discharge Instructions (Addendum)
You were seen in the emergency room for fall, that has resulted in a collarbone fracture. Take Tylenol every 6 hours.  Take stronger pain medicine only if needed.  Ice the area of pain aggressively for the next 3 days(15 to 20 minutes of ice therapy every 4 hours).

## 2023-01-11 NOTE — ED Notes (Signed)
Discharge paperwork given and verbally understood. 

## 2023-01-11 NOTE — ED Triage Notes (Signed)
Pt arrives to ED with c/o right sided shoulder pina/injury after a fall out of bed this morning. Pt notes that she rolled out of bed. Denies head/neck injury, no LOC.

## 2023-04-27 ENCOUNTER — Other Ambulatory Visit: Payer: Self-pay | Admitting: *Deleted

## 2023-04-27 DIAGNOSIS — I6523 Occlusion and stenosis of bilateral carotid arteries: Secondary | ICD-10-CM

## 2023-05-12 ENCOUNTER — Ambulatory Visit (HOSPITAL_COMMUNITY)
Admission: RE | Admit: 2023-05-12 | Discharge: 2023-05-12 | Disposition: A | Discharge status: Home / Self Care | Payer: Medicare Other | Source: Ambulatory Visit | Attending: Vascular Surgery | Admitting: Vascular Surgery

## 2023-05-12 ENCOUNTER — Ambulatory Visit: Payer: Medicare Other | Admitting: Physician Assistant

## 2023-05-12 VITALS — BP 154/85 | HR 76 | Temp 97.8°F | Resp 18 | Ht 63.0 in | Wt 125.5 lb

## 2023-05-12 DIAGNOSIS — I6523 Occlusion and stenosis of bilateral carotid arteries: Secondary | ICD-10-CM

## 2023-05-12 NOTE — Progress Notes (Signed)
History of Present Illness:  Patient is a 77 y.o. year old female who presents for evaluation of carotid stenosis.  Previous underwent a left carotid endarterectomy with Dr. Arbie Cookey in 2019. On surveillance was found to have high-grade stenosis in the distal common carotid artery at the proximal patch site. She underwent left TCAR on 03/20/2021. Overall today she states she is doing well. Has had some trouble tolerating the Zocor but remains on aspirin. Has stopped the Plavix. No neurologic events since last evaluation.   She told me she is doing well overall.  She did fall out of her bed back in Aug. 2024 and break her right clavicle.  She states she felt like she was waring out her side of the mattress and decided to sleep on the other side.  When she woke up she forgot and feel out of the bed.     Past Medical History:  Diagnosis Date   Breast cancer (HCC) 2018   Left breast   Breast cancer of upper-outer quadrant of left female breast (HCC)    Depression 2010   H/O seasonal allergies    History of kidney stones    Hyperlipidemia    Hypertension    Personal history of radiation therapy     Past Surgical History:  Procedure Laterality Date   BREAST BIOPSY Right 2018   benign   BREAST LUMPECTOMY Left 07/22/2016   BREAST LUMPECTOMY WITH RADIOACTIVE SEED AND SENTINEL LYMPH NODE BIOPSY Left 07/22/2016   Procedure: BREAST LUMPECTOMY WITH RADIOACTIVE SEED AND SENTINEL LYMPH NODE BIOPSY;  Surgeon: Glenna Fellows, MD;  Location: [facility];  Service: General;  Laterality: Left;   CATARACT EXTRACTION, BILATERAL     ENDARTERECTOMY Left 08/03/2017   Procedure: ENDARTERECTOMY CAROTID LEFT;  Surgeon: Larina Earthly, MD;  [facility];  Service: Vascular;  Laterality: Left;   TRANSCAROTID ARTERY REVASCULARIZATION  Left 03/20/2021   Procedure: LEFT TRANSCAROTID ARTERY REVASCULARIZATION;  Surgeon: Cephus Shelling, MD;  Location: [facility] OR;  Service: Vascular;   Laterality: Left;   ULTRASOUND GUIDANCE FOR VASCULAR ACCESS  03/20/2021   Procedure: ULTRASOUND GUIDANCE FOR VASCULAR ACCESS;  Surgeon: Cephus Shelling, MD;  Location: [facility] OR;  Service: Vascular;;     Social History Social History   Tobacco Use   Smoking status: Every Day    Current packs/day: 1.00    Average packs/day: 1 pack/day for 30.0 years (30.0 ttl pk-yrs)    Types: Cigarettes   Smokeless tobacco: Never  Vaping Use   Vaping status: Never Used  Substance Use Topics   Alcohol use: No   Drug use: No    Family History Family History  Problem Relation Age of Onset   Lung cancer Sister    Heart disease Sister    Hypertension Sister    Cancer Sister    Stroke Sister    COPD Sister    Kidney disease Mother    Heart disease Father     Allergies  Allergies  Allergen Reactions   Pseudoephedrine Other (See Comments)    Insomnia   Sulfa Antibiotics Itching and Other (See Comments)    Unsure - Child allergy   Arimidex [Anastrozole] Other (See Comments)    Night Sweats   Levofloxacin     Other reaction(s): palpitations   Pravachol [Pravastatin]     Other reaction(s): myalgia   Amoxicillin-Pot Clavulanate Diarrhea     Current Outpatient Medications  Medication Sig Dispense Refill   acetaminophen (  TYLENOL) 500 MG tablet Take 1 tablet (500 mg total) by mouth every 6 (six) hours as needed. 30 tablet 0   ALPRAZolam (XANAX) 0.25 MG tablet Take 0.25 mg by mouth every 6 (six) hours as needed for anxiety.     aspirin 81 MG tablet Take 81 mg by mouth daily.     cetirizine (ZYRTEC ALLERGY) 10 MG tablet Take 1 tablet (10 mg total) by mouth daily.     Cholecalciferol (VITAMIN D3) 50 MCG (2000 UT) TABS Take 2,000 Units by mouth daily.     fluticasone (FLONASE) 50 MCG/ACT nasal spray Place 1 spray into both nostrils daily.     hydrochlorothiazide (MICROZIDE) 12.5 MG capsule Take 1 capsule (12.5 mg total) by mouth daily.     ibuprofen (ADVIL,MOTRIN) 200 MG tablet Take 400 mg  by mouth every 8 (eight) hours as needed for mild pain or headache.     lisinopril (PRINIVIL,ZESTRIL) 20 MG tablet Take 20 mg by mouth daily.     Multiple Vitamins-Minerals (MULTIVITAMIN WITH MINERALS) tablet Take 1 tablet by mouth daily.     pantoprazole (PROTONIX) 40 MG tablet Take 40 mg by mouth daily.     Probiotic Product (PROBIOTIC DAILY PO) Take 1 tablet by mouth 2 (two) times a week.     clopidogrel (PLAVIX) 75 MG tablet Take 1 tablet (75 mg total) by mouth daily. (Patient not taking: Reported on 05/12/2023) 30 tablet 6   oxyCODONE-acetaminophen (PERCOCET/ROXICET) 5-325 MG tablet Take 1 tablet by mouth every 6 (six) hours as needed for severe pain. (Patient not taking: Reported on 05/12/2023) 6 tablet 0   simvastatin (ZOCOR) 10 MG tablet Take 1 tablet (10 mg total) by mouth at bedtime. (Patient not taking: Reported on 05/12/2023) 90 tablet 1   No current facility-administered medications for this visit.    ROS:   General:  No weight loss, Fever, chills  HEENT: No recent headaches, no nasal bleeding, no visual changes, no sore throat  Neurologic: No dizziness, blackouts, seizures. No recent symptoms of stroke or mini- stroke. No recent episodes of slurred speech, or temporary blindness.  Cardiac: No recent episodes of chest pain/pressure, no shortness of breath at rest.  No shortness of breath with exertion.  Denies history of atrial fibrillation or irregular heartbeat  Vascular: No history of rest pain in feet.  No history of claudication.  No history of non-healing ulcer, No history of DVT   Pulmonary: No home oxygen, no productive cough, no hemoptysis,  No asthma or wheezing  Musculoskeletal:  [x ] Arthritis, [ ]  Low back pain,  [ ]  Joint pain  Hematologic:No history of hypercoagulable state.  No history of easy bleeding.  No history of anemia  Gastrointestinal: No hematochezia or melena,  No gastroesophageal reflux, no trouble swallowing  Urinary: [ ]  chronic Kidney  disease, [ ]  on HD - [ ]  MWF or [ ]  TTHS, [ ]  Burning with urination, [ ]  Frequent urination, [ ]  Difficulty urinating;   Skin: No rashes  Psychological: No history of anxiety,  No history of depression   Physical Examination  Vitals:   05/12/23 1228  BP: (!) 154/85  Pulse: 76  Resp: 18  Temp: 97.8 F (36.6 C)  TempSrc: Temporal  SpO2: 95%  Weight: 125 lb 8 oz (56.9 kg)  Height: 5\' 3"  (1.6 m)    Body mass index is 22.23 kg/m.  General:  Alert and oriented, no acute distress HEENT: Normal Neck: No bruit or JVD Pulmonary: Clear to auscultation  bilaterally Cardiac: Regular Rate and Rhythm without murmur Gastrointestinal: Soft, non-tender, non-distended, no mass, no scars Skin: No rash Extremity Pulses:   radial pulses bilaterally Musculoskeletal: No deformity or edema  Neurologic: Upper and lower extremity motor 5/5 and symmetric  DATA:   Right Carotid Findings:  +----------+--------+--------+--------+------------------+--------+           PSV cm/sEDV cm/sStenosisPlaque DescriptionComments  +----------+--------+--------+--------+------------------+--------+  CCA Prox  89      19                                          +----------+--------+--------+--------+------------------+--------+  CCA Mid   70      17                                          +----------+--------+--------+--------+------------------+--------+  CCA Distal58      14              hypoechoic                  +----------+--------+--------+--------+------------------+--------+  ICA Prox  72      19      1-39%   heterogenous                +----------+--------+--------+--------+------------------+--------+  ICA Mid   93      31                                          +----------+--------+--------+--------+------------------+--------+  ICA Distal86      28                                           +----------+--------+--------+--------+------------------+--------+  ECA      90      15                                          +----------+--------+--------+--------+------------------+--------+   +----------+--------+-------+----------------+-------------------+           PSV cm/sEDV cmsDescribe        Arm Pressure (mmHG)  +----------+--------+-------+----------------+-------------------+  [id number]    5      Multiphasic, [id number]                  +----------+--------+-------+----------------+-------------------+   +---------+--------+--+--------+--+---------+  VertebralPSV cm/s62EDV cm/s19Antegrade  +---------+--------+--+--------+--+---------+      Left Carotid Findings:  +----------+--------+--------+--------+------------------+--------+           PSV cm/sEDV cm/sStenosisPlaque DescriptionComments  +----------+--------+--------+--------+------------------+--------+  CCA Prox  73      24                                          +----------+--------+--------+--------+------------------+--------+  CCA Mid   73      24                                          +----------+--------+--------+--------+------------------+--------+  CCA Distal                                          stent     +----------+--------+--------+--------+------------------+--------+  ICA Prox                                            stent     +----------+--------+--------+--------+------------------+--------+  ICA Mid                                             stent     +----------+--------+--------+--------+------------------+--------+  ICA Distal108     35                                          +----------+--------+--------+--------+------------------+--------+  ECA      74      12                                          +----------+--------+--------+--------+------------------+--------+    +----------+--------+--------+----------------+-------------------+           PSV cm/sEDV cm/sDescribe        Arm Pressure (mmHG)  +----------+--------+--------+----------------+-------------------+  Subclavian127    5       Multiphasic, [id number]                  +----------+--------+--------+----------------+-------------------+   +---------+--------+--------+------+  VertebralPSV cm/sEDV cm/sAbsent  +---------+--------+--------+------+      Left Stent(s):  +---------------+---+--++++  Prox to Stent  70 25  +---------------+---+--++++  Proximal Stent 10627  +---------------+---+--++++  Mid Stent      29562  +---------------+---+--++++  Distal Stent   15439  +---------------+---+--++++  Distal to [id number]  +---------------+---+--++++     Summary:  Right Carotid: Velocities in the right ICA are consistent with a 1-39%  stenosis.   Left Carotid: Patent stent with no visualized steosis.   Vertebrals:  Right vertebral artery demonstrates antegrade flow. Left  vertebral              artery demonstrates an occlusion.  Subclavians: Normal flow hemodynamics were seen in bilateral subclavian               arteries.    ASSESSMENT/PLAN: Carotid stenosis with history of left carotid endarterectomy with Dr. Arbie Cookey in 2019. On surveillance was found to have high-grade stenosis in the distal common carotid artery at the proximal patch site. She underwent left TCAR on 03/20/2021.   There is no evidence of re stenosis.  B ICA's are < 39% stenosis. She denies stroke or TIA symptoms.  I will schedule her for follow up in 1 year for repeat surveillance.         [facility] Office: [phone] Pager: [phone]

## 2023-05-19 ENCOUNTER — Other Ambulatory Visit: Payer: Self-pay

## 2023-05-19 DIAGNOSIS — I6523 Occlusion and stenosis of bilateral carotid arteries: Secondary | ICD-10-CM

## 2023-12-08 ENCOUNTER — Other Ambulatory Visit: Payer: Self-pay | Admitting: Internal Medicine

## 2023-12-08 DIAGNOSIS — Z1231 Encounter for screening mammogram for malignant neoplasm of breast: Secondary | ICD-10-CM

## 2023-12-18 ENCOUNTER — Encounter: Payer: Self-pay | Admitting: Advanced Practice Midwife

## 2023-12-28 ENCOUNTER — Ambulatory Visit
Admission: RE | Admit: 2023-12-28 | Discharge: 2023-12-28 | Disposition: A | Discharge status: Home / Self Care | Source: Ambulatory Visit | Attending: Internal Medicine | Admitting: Internal Medicine

## 2023-12-28 DIAGNOSIS — Z1231 Encounter for screening mammogram for malignant neoplasm of breast: Secondary | ICD-10-CM

## 2024-03-25 ENCOUNTER — Other Ambulatory Visit: Payer: Self-pay | Admitting: General Surgery

## 2024-03-25 DIAGNOSIS — L905 Scar conditions and fibrosis of skin: Secondary | ICD-10-CM

## 2024-04-05 ENCOUNTER — Ambulatory Visit
Admission: RE | Admit: 2024-04-05 | Discharge: 2024-04-05 | Disposition: A | Source: Ambulatory Visit | Attending: General Surgery | Admitting: General Surgery

## 2024-04-05 ENCOUNTER — Ambulatory Visit
Admission: RE | Admit: 2024-04-05 | Discharge: 2024-04-05 | Disposition: A | Discharge status: Home / Self Care | Source: Ambulatory Visit | Attending: General Surgery | Admitting: General Surgery

## 2024-04-05 DIAGNOSIS — L905 Scar conditions and fibrosis of skin: Secondary | ICD-10-CM

## 2024-04-07 ENCOUNTER — Ambulatory Visit: Payer: Self-pay | Admitting: General Surgery
